# Patient Record
Sex: Female | Born: 1970 | Race: White | Hispanic: No | Marital: Married | Smoking: Current every day smoker
Health system: Southern US, Community
[De-identification: ages and names within clinical notes are randomized; demographics above are authoritative.]

## PROBLEM LIST (undated history)

## (undated) DIAGNOSIS — Q441 Other congenital malformations of gallbladder: Secondary | ICD-10-CM

## (undated) HISTORY — PX: ECTOPIC PREGNANCY SURGERY: SHX613

## (undated) HISTORY — PX: ABDOMINAL HYSTERECTOMY: SHX81

## (undated) HISTORY — PX: MOUTH SURGERY: SHX715

## (undated) HISTORY — PX: TONSILLECTOMY: SUR1361

## (undated) HISTORY — PX: NOVASURE ABLATION: SHX5394

---

## 2011-10-01 ENCOUNTER — Emergency Department (HOSPITAL_BASED_OUTPATIENT_CLINIC_OR_DEPARTMENT_OTHER): Payer: Self-pay

## 2011-10-01 ENCOUNTER — Observation Stay (HOSPITAL_BASED_OUTPATIENT_CLINIC_OR_DEPARTMENT_OTHER)
Admission: EM | Admit: 2011-10-01 | Discharge: 2011-10-02 | Disposition: A | Discharge status: Home / Self Care | Payer: Self-pay | Attending: Internal Medicine | Admitting: Internal Medicine

## 2011-10-01 ENCOUNTER — Encounter (HOSPITAL_BASED_OUTPATIENT_CLINIC_OR_DEPARTMENT_OTHER): Payer: Self-pay | Admitting: *Deleted

## 2011-10-01 DIAGNOSIS — F17200 Nicotine dependence, unspecified, uncomplicated: Secondary | ICD-10-CM

## 2011-10-01 DIAGNOSIS — J039 Acute tonsillitis, unspecified: Secondary | ICD-10-CM

## 2011-10-01 DIAGNOSIS — Z720 Tobacco use: Secondary | ICD-10-CM | POA: Diagnosis present

## 2011-10-01 DIAGNOSIS — J3501 Chronic tonsillitis: Principal | ICD-10-CM | POA: Insufficient documentation

## 2011-10-01 LAB — BASIC METABOLIC PANEL
BUN: 7 mg/dL (ref 6–23)
BUN: 6 mg/dL (ref 6–23)
CO2: 24 mEq/L (ref 19–32)
CO2: 25 mEq/L (ref 19–32)
Chloride: 100 mEq/L (ref 96–112)
Chloride: 103 mEq/L (ref 96–112)
Creatinine, Ser: 0.57 mg/dL (ref 0.50–1.10)
GFR calc non Af Amer: >90 mL/min (ref >90)
Glucose, Bld: 115 mg/dL — ABNORMAL HIGH (ref 70–99)
Potassium: 3.6 mEq/L (ref 3.5–5.1)
Potassium: 3.5 mEq/L (ref 3.5–5.1)

## 2011-10-01 LAB — CBC
HCT: 39.4 % (ref 36.0–46.0)
Hemoglobin: 13.7 g/dL (ref 12.0–15.0)
MCV: 89.5 fL (ref 78.0–100.0)
RBC: 4.4 MIL/uL (ref 3.87–5.11)
RDW: 12.9 % (ref 11.5–15.5)
WBC: 11 10*3/uL — ABNORMAL HIGH (ref 4.0–10.5)

## 2011-10-01 LAB — CBC WITH DIFFERENTIAL/PLATELET
Basophils Absolute: 0 10*3/uL (ref 0.0–0.1)
Basophils Relative: 0 % (ref 0–1)
Hemoglobin: 13.6 g/dL (ref 12.0–15.0)
Lymphocytes Relative: 28 % (ref 12–46)
Lymphs Abs: 3 10*3/uL (ref 0.7–4.0)
MCHC: 35.4 g/dL (ref 30.0–36.0)
Neutro Abs: 7.3 10*3/uL (ref 1.7–7.7)
Neutrophils Relative %: 68 % (ref 43–77)
Platelets: 267 10*3/uL (ref 150–400)
Promyelocytes Absolute: 0 %
RBC: 4.35 MIL/uL (ref 3.87–5.11)
nRBC: 0 /100 WBC

## 2011-10-01 LAB — CREATININE, SERUM: GFR calc Af Amer: >90 mL/min (ref >90)

## 2011-10-01 LAB — RAPID STREP SCREEN (MED CTR MEBANE ONLY): Streptococcus, Group A Screen (Direct): NEGATIVE

## 2011-10-01 LAB — MAGNESIUM: Magnesium: 2.1 mg/dL (ref 1.5–2.5)

## 2011-10-01 MED ORDER — SODIUM CHLORIDE 0.9 % IV SOLN
INTRAVENOUS | Status: AC
  Administered 2011-10-01: 07:00:00 via INTRAVENOUS

## 2011-10-01 MED ORDER — NICOTINE 14 MG/24HR TD PT24
14.0000 mg | MEDICATED_PATCH | Freq: Every day | TRANSDERMAL | Status: DC
  Administered 2011-10-01: 14 mg via TRANSDERMAL
  Filled 2011-10-01 (×2): qty 1

## 2011-10-01 MED ORDER — PREDNISONE 5 MG/5ML PO SOLN
10.0000 mg | Freq: Two times a day (BID) | ORAL | Status: DC
  Administered 2011-10-01 – 2011-10-02 (×3): 10 mg via ORAL
  Filled 2011-10-01 (×5): qty 10

## 2011-10-01 MED ORDER — SODIUM CHLORIDE 0.9 % IJ SOLN: 3.0000 mL | Freq: Two times a day (BID) | INTRAMUSCULAR | Status: DC

## 2011-10-01 MED ORDER — ACETAMINOPHEN 325 MG PO TABS
650.0000 mg | ORAL_TABLET | ORAL | Status: DC | PRN
  Administered 2011-10-01 (×2): 650 mg via ORAL
  Filled 2011-10-01 (×3): qty 2

## 2011-10-01 MED ORDER — SODIUM CHLORIDE 0.9 % IV SOLN
INTRAVENOUS | Status: DC
  Administered 2011-10-01 (×2): via INTRAVENOUS
  Administered 2011-10-01: 250 mL via INTRAVENOUS
  Administered 2011-10-01: via INTRAVENOUS

## 2011-10-01 MED ORDER — ONDANSETRON HCL 4 MG/2ML IJ SOLN
4.0000 mg | Freq: Once | INTRAMUSCULAR | Status: AC
  Administered 2011-10-01: 4 mg via INTRAVENOUS
  Filled 2011-10-01: qty 2

## 2011-10-01 MED ORDER — FENTANYL CITRATE 0.05 MG/ML IJ SOLN
50.0000 ug | INTRAMUSCULAR | Status: DC | PRN
  Administered 2011-10-01 – 2011-10-02 (×5): 50 ug via INTRAVENOUS
  Filled 2011-10-01 (×4): qty 2

## 2011-10-01 MED ORDER — FENTANYL CITRATE 0.05 MG/ML IJ SOLN
100.0000 ug | Freq: Once | INTRAMUSCULAR | Status: AC
  Administered 2011-10-01: 100 ug via INTRAVENOUS
  Filled 2011-10-01: qty 2

## 2011-10-01 MED ORDER — DEXAMETHASONE SODIUM PHOSPHATE 10 MG/ML IJ SOLN
10.0000 mg | Freq: Once | INTRAMUSCULAR | Status: AC
  Administered 2011-10-01: 10 mg via INTRAVENOUS
  Filled 2011-10-01: qty 1

## 2011-10-01 MED ORDER — FENTANYL CITRATE 0.05 MG/ML IJ SOLN
50.0000 ug | Freq: Once | INTRAMUSCULAR | Status: AC
  Administered 2011-10-01: 50 ug via INTRAVENOUS

## 2011-10-01 MED ORDER — CLINDAMYCIN PHOSPHATE 600 MG/50ML IV SOLN
600.0000 mg | Freq: Once | INTRAVENOUS | Status: AC
  Administered 2011-10-01: 600 mg via INTRAVENOUS
  Filled 2011-10-01: qty 50

## 2011-10-01 MED ORDER — ONDANSETRON HCL 4 MG/2ML IJ SOLN: 4.0000 mg | Freq: Three times a day (TID) | INTRAMUSCULAR | Status: AC | PRN

## 2011-10-01 MED ORDER — FENTANYL CITRATE 0.05 MG/ML IJ SOLN
INTRAMUSCULAR | Status: AC
  Administered 2011-10-01: 50 ug via INTRAVENOUS
  Filled 2011-10-01: qty 2

## 2011-10-01 MED ORDER — ENOXAPARIN SODIUM 40 MG/0.4ML ~~LOC~~ SOLN
40.0000 mg | SUBCUTANEOUS | Status: DC
  Administered 2011-10-01: 40 mg via SUBCUTANEOUS
  Filled 2011-10-01 (×2): qty 0.4

## 2011-10-01 MED ORDER — IOHEXOL 300 MG/ML  SOLN
80.0000 mL | Freq: Once | INTRAMUSCULAR | Status: AC | PRN
  Administered 2011-10-01: 80 mL via INTRAVENOUS

## 2011-10-01 MED ORDER — CLINDAMYCIN PHOSPHATE 600 MG/50ML IV SOLN
600.0000 mg | Freq: Four times a day (QID) | INTRAVENOUS | Status: DC
  Administered 2011-10-01 – 2011-10-02 (×5): 600 mg via INTRAVENOUS
  Filled 2011-10-01 (×11): qty 50

## 2011-10-01 NOTE — Progress Notes (Signed)
Patient ID: Cheryl Simon, female   DOB: 05-20-1970, 41 y.o.   MRN: [id number] Pt seen and focused examination performed.   Review of systems: The patient's only complaint is pain in the throat. She has been able to tolerate a liquid diet throughout the day. She otherwise has no other complaint.  Spoke with Dr. Ezzard Standing who has given the patient the option to have a tonsillectomy done tomorrow morning or to have it done electively in the future. The patient has conferred with her husband and decision has been made to proceed with the tonsillectomy tomorrow morning. I have asked the nurse to contact Dr. Ezzard Standing and make him aware of the patient's decision so that she can reschedule for procedure tomorrow morning  Focused examination: HEENT: Patient has 3+ tonsils bilaterally with whitish exudate on left tonsil. No stridor noted on examination Lungs: Clear to auscultation no wheezing noted. Cardiovascular: S1 and S2 normal no murmurs rubs or gallops noted.  Plan: Patient will have steroids discontinued at her dose tonight. I've asked him to contact Dr. Ezzard Standing for preoperative orders and the patient is planned to have a tonsillectomy in the morning. Meanwhile she'll continue on IV clindamycin until she is able to take orals and will likely be discharged home on Augmentin.  Total time in this visit approximately 20 minutes

## 2011-10-01 NOTE — Consult Note (Signed)
Reason for Consult:evaluate patient with tonsillitis and early abcess Referring Physician: ER  Cheryl Simon is an 41 y.o. female.  HPI: 3 day history of worsening sore throat and trouble swallowing. CT scan showed enlarged tonsils and early abscess on L side. apprx 1 cm. She was treatd with steroids and ant biotics IV and doing much better today. Able to swallow and no airway compromise. Has had history of tonsil infections in the past with large tonsils.  History reviewed. No pertinent past medical history.  Past Surgical History  Procedure Date  . Abdominal hysterectomy     Social History:  reports that she has been smoking Cigarettes.  She has a 20 pack-year smoking history. She does not have any smokeless tobacco history on file. She reports that she drinks alcohol. She reports that she uses illicit drugs (Marijuana) about once per week.  Allergies:  Allergies  Allergen Reactions  . Dilaudid (Hydromorphone Hcl)     SOB     Medications: I have reviewed the patient's current medications.  Results for orders placed during the hospital encounter of 10/01/11 (from the past 48 hour(s))  RAPID STREP SCREEN     Status: Normal   Collection Time   10/01/11 12:43 AM      Component Value Range Comment   Streptococcus, Group A Screen (Direct) NEGATIVE  NEGATIVE   CBC WITH DIFFERENTIAL     Status: Abnormal   Collection Time   10/01/11  1:21 AM      Component Value Range Comment   WBC 10.7 (*) 4.0 - 10.5 K/uL WHITE COUNT CONFIRMED ON SMEAR   RBC 4.35  3.87 - 5.11 MIL/uL    Hemoglobin 13.6  12.0 - 15.0 g/dL    HCT 03.4  74.2 - 59.5 %    MCV 88.3  78.0 - 100.0 fL    MCH 31.3  26.0 - 34.0 pg    MCHC 35.4  30.0 - 36.0 g/dL    RDW 63.8  75.6 - 43.3 %    Platelets 267  150 - 400 K/uL    Neutrophils Relative 68  43 - 77 %    Lymphocytes Relative 28  12 - 46 %    Monocytes Relative 4  3 - 12 %    Eosinophils Relative 0  0 - 5 %    Basophils Relative 0  0 - 1 %    Band Neutrophils 0  0 -  10 %    Metamyelocytes Relative 0      Myelocytes 0      Promyelocytes Absolute 0      Blasts 0      nRBC 0  0 /100 WBC    Neutro Abs 7.3  1.7 - 7.7 K/uL    Lymphs Abs 3.0  0.7 - 4.0 K/uL    Monocytes Absolute 0.4  0.1 - 1.0 K/uL    Eosinophils Absolute 0.0  0.0 - 0.7 K/uL    Basophils Absolute 0.0  0.0 - 0.1 K/uL    WBC Morphology ATYPICAL LYMPHOCYTES      Smear Review LARGE PLATELETS PRESENT     BASIC METABOLIC PANEL     Status: Abnormal   Collection Time   10/01/11  1:21 AM      Component Value Range Comment   Sodium 137  135 - 145 mEq/L    Potassium 3.6  3.5 - 5.1 mEq/L    Chloride 100  96 - 112 mEq/L    CO2 24  19 -  32 mEq/L    Glucose, Bld 115 (*) 70 - 99 mg/dL    BUN 7  6 - 23 mg/dL    Creatinine, Ser 1.61  0.50 - 1.10 mg/dL    Calcium 9.2  8.4 - 09.6 mg/dL    GFR calc non Af Amer >90  >90 mL/min    GFR calc Af Amer >90  >90 mL/min   MONONUCLEOSIS SCREEN     Status: Normal   Collection Time   10/01/11  2:00 AM      Component Value Range Comment   Mono Screen NEGATIVE  NEGATIVE   CBC     Status: Abnormal   Collection Time   10/01/11  8:14 AM      Component Value Range Comment   WBC 11.0 (*) 4.0 - 10.5 K/uL    RBC 4.40  3.87 - 5.11 MIL/uL    Hemoglobin 13.7  12.0 - 15.0 g/dL    HCT 04.5  40.9 - 81.1 %    MCV 89.5  78.0 - 100.0 fL    MCH 31.1  26.0 - 34.0 pg    MCHC 34.8  30.0 - 36.0 g/dL    RDW 91.4  78.2 - 95.6 %    Platelets 277  150 - 400 K/uL   CREATININE, SERUM     Status: Normal   Collection Time   10/01/11  8:14 AM      Component Value Range Comment   Creatinine, Ser 0.55  0.50 - 1.10 mg/dL    GFR calc non Af Amer >90  >90 mL/min    GFR calc Af Amer >90  >90 mL/min     Ct Soft Tissue Neck W Contrast  10/01/2011  *RADIOLOGY REPORT*  Clinical Data: Sore throat, evaluate for peritonsillar abscess. Fever, body aches.  CT NECK WITH CONTRAST  Technique:  Multidetector CT imaging of the neck was performed with intravenous contrast.  Contrast: 80mL OMNIPAQUE  IOHEXOL 300 MG/ML  SOLN  Comparison: None.  Findings: Visualized intracranial contents are within normal limits.  Lung apices are clear.  Unremarkable nasopharynx and nasal cavity.  Bilateral tonsillar enlargement, left greater than right, with a 1 cm hypoattenuation along the anterolateral margin of the left tonsil as seen on series 3 image 41.  The tonsillar hypertrophy results in significant oral pharyngeal airway narrowing. Unremarkable oral cavity.  Associated lymphatic hypertrophy extends inferiorly along the anterior margin to the level of the valleculae.  The epiglottis appears normal.  Unremarkable hypopharynx, larynx, and thyroid gland.  Normal caliber vasculature.  Symmetric parotid glands.  There is a prominent left anterior cervical chain lymph nodes measuring up to 1.3 cm short axis.  Submandibular gland on the left is mildly prominent however enhances homogeneous to the contralateral side.  No acute osseous finding.  IMPRESSION: Bilateral palatine tonsillar enlargement with phlegmon/developing peritonsillar abscess on the left measuring 1 cm.  There is continuous lymphatic hypertrophy extending inferior from the tonsils to the level of the valleculae. Likely infectious/inflammatory,  however recommend direct inspection to exclude mass.  Prominent left cervical chain lymph nodes, may be reactive.  Original Report Authenticated By: Waneta Martins, M.D.    OZH:YQMV throat doing better today. No trouble breathing   PE:A & O x 3. No airway problems. Oral exam 3+ tonsils bilaterally with purrulence on the L  tonsil Minimal neck adenoathy  Assessment/Plan: Acute tonsillitis with early abscess   Recommendation: Patient needs tonsillectomy at some point. Could consider tonsillectomy this hospital stay tomorrow am  or treat with antibiotics (I prefer Augmentin) and schedule tonsillectomy at a later date. Can follow up with me as outpatient  [phone] Dillard Cannon 10/01/2011, 12:34 PM

## 2011-10-01 NOTE — ED Notes (Signed)
Pt with sore throat congestion body aches and fever x 3 days pt tearful and anxious in triage

## 2011-10-01 NOTE — Progress Notes (Signed)
PENDING ACCEPTANCE TRANFER NOTE:  Call received from:   Dr Paula Libra from [facility]  REASON FOR REQUESTING TRANSFER:    Severe tonsillitis  HPI:   41 yo with swollen "kissing" tonsils.  Seen by CT as well.  Not drooling, but very anxious and having anterior instead of posterior cervical adenopathy.  He has consulted ENT, who felt it is not a surgical problem at this time and will consult later today.  She was given steroids and Clindamycin.  Monotest was negative at this time.   PLAN:  According to telephone report, this patient was accepted for transfer to either stepdown or telemetry as per Dr Thayer Ohm judgement since I have not seen this patient,   Under [facility] team:  [employer] or [facility],  I have requested an order be written to call Flow Manager at [phone] upon patient arrival to the floor for final physician assignment who will do the admission and give admitting orders.  SIGNEDHouston Siren, MD [facility]  10/01/2011, 3:47 AM

## 2011-10-01 NOTE — ED Provider Notes (Signed)
History     CSN: [id number]  Arrival date & time 10/01/11  0029   First MD Initiated Contact with Patient 10/01/11 0055      Chief Complaint  Patient presents with  . Sore Throat    (Consider location/radiation/quality/duration/timing/severity/associated sxs/prior treatment) HPI This is a 41 year old white female with a three-day history of sore throat. The pain in throat has become severe and is worse with swallowing. She's not been able to eat due to pain. She also feels like she is having difficulty breathing due to the swelling in her throat. She's not sure she's had a fever as she states she has not had time to take her temperature. The pain is radiating to her face particularly on the left. There is enlargement of her anterior cervical lymph nodes. She denies nausea, vomiting or diarrhea. She has had an occasional cough.  History reviewed. No pertinent past medical history.  Past Surgical History  Procedure Date  . Abdominal hysterectomy     History reviewed. No pertinent family history.  History  Substance Use Topics  . Smoking status: Current Everyday Smoker  . Smokeless tobacco: Not on file  . Alcohol Use: No    OB History    Grav Para Term Preterm Abortions TAB SAB Ect Mult Living                  Review of Systems  All other systems reviewed and are negative.    Allergies  Dilaudid  Home Medications  No current outpatient prescriptions on file.  BP 136/51  Pulse 84  Temp 99.4 F (37.4 C) (Oral)  Resp 18  SpO2 99%  Physical Exam General: Well-developed, well-nourished female in no acute distress; appearance consistent with age of record HENT: normocephalic, atraumatic; erythematous, exudative, enlarged "kissing" tonsils with slight deviation to the right; "hot potato" voice; TMs normal Eyes: pupils equal round and reactive to light; extraocular muscles intact Neck: supple; tender anterior cervical lymphadenopathy Heart: regular rate and rhythm;  no murmurs, rubs or gallops Lungs: clear to auscultation bilaterally Abdomen: soft; nondistended; nontender Extremities: No deformity; full range of motion Neurologic: Awake, alert; motor function intact in all extremities and symmetric; no facial droop Skin: Warm and dry Psychiatric: Anxious; tearful    ED Course  Procedures (including critical care time)     MDM   Nursing notes and vitals signs, including pulse oximetry, reviewed.  Summary of this visit's results, reviewed by myself:  Labs:  Results for orders placed during the hospital encounter of 10/01/11  RAPID STREP SCREEN      Component Value Range   Streptococcus, Group A Screen (Direct) NEGATIVE  NEGATIVE  CBC WITH DIFFERENTIAL      Component Value Range   WBC 10.7 (*) 4.0 - 10.5 K/uL   RBC 4.35  3.87 - 5.11 MIL/uL   Hemoglobin 13.6  12.0 - 15.0 g/dL   HCT 46.9  62.9 - 52.8 %   MCV 88.3  78.0 - 100.0 fL   MCH 31.3  26.0 - 34.0 pg   MCHC 35.4  30.0 - 36.0 g/dL   RDW 41.3  24.4 - 01.0 %   Platelets 267  150 - 400 K/uL   Neutrophils Relative 68  43 - 77 %   Lymphocytes Relative 28  12 - 46 %   Monocytes Relative 4  3 - 12 %   Eosinophils Relative 0  0 - 5 %   Basophils Relative 0  0 - 1 %  Band Neutrophils 0  0 - 10 %   Metamyelocytes Relative 0     Myelocytes 0     Promyelocytes Absolute 0     Blasts 0     nRBC 0  0 /100 WBC   Neutro Abs 7.3  1.7 - 7.7 K/uL   Lymphs Abs 3.0  0.7 - 4.0 K/uL   Monocytes Absolute 0.4  0.1 - 1.0 K/uL   Eosinophils Absolute 0.0  0.0 - 0.7 K/uL   Basophils Absolute 0.0  0.0 - 0.1 K/uL   WBC Morphology ATYPICAL LYMPHOCYTES     Smear Review LARGE PLATELETS PRESENT    BASIC METABOLIC PANEL      Component Value Range   Sodium 137  135 - 145 mEq/L   Potassium 3.6  3.5 - 5.1 mEq/L   Chloride 100  96 - 112 mEq/L   CO2 24  19 - 32 mEq/L   Glucose, Bld 115 (*) 70 - 99 mg/dL   BUN 7  6 - 23 mg/dL   Creatinine, Ser 1.61  0.50 - 1.10 mg/dL   Calcium 9.2  8.4 - 09.6 mg/dL   GFR  calc non Af Amer >90  >90 mL/min   GFR calc Af Amer >90  >90 mL/min  MONONUCLEOSIS SCREEN      Component Value Range   Mono Screen NEGATIVE  NEGATIVE    Imaging Studies: Ct Soft Tissue Neck W Contrast  10/01/2011  *RADIOLOGY REPORT*  Clinical Data: Sore throat, evaluate for peritonsillar abscess. Fever, body aches.  CT NECK WITH CONTRAST  Technique:  Multidetector CT imaging of the neck was performed with intravenous contrast.  Contrast: 80mL OMNIPAQUE IOHEXOL 300 MG/ML  SOLN  Comparison: None.  Findings: Visualized intracranial contents are within normal limits.  Lung apices are clear.  Unremarkable nasopharynx and nasal cavity.  Bilateral tonsillar enlargement, left greater than right, with a 1 cm hypoattenuation along the anterolateral margin of the left tonsil as seen on series 3 image 41.  The tonsillar hypertrophy results in significant oral pharyngeal airway narrowing. Unremarkable oral cavity.  Associated lymphatic hypertrophy extends inferiorly along the anterior margin to the level of the valleculae.  The epiglottis appears normal.  Unremarkable hypopharynx, larynx, and thyroid gland.  Normal caliber vasculature.  Symmetric parotid glands.  There is a prominent left anterior cervical chain lymph nodes measuring up to 1.3 cm short axis.  Submandibular gland on the left is mildly prominent however enhances homogeneous to the contralateral side.  No acute osseous finding.  IMPRESSION: Bilateral palatine tonsillar enlargement with phlegmon/developing peritonsillar abscess on the left measuring 1 cm.  There is continuous lymphatic hypertrophy extending inferior from the tonsils to the level of the valleculae. Likely infectious/inflammatory,  however recommend direct inspection to exclude mass.  Prominent left cervical chain lymph nodes, may be reactive.  Original Report Authenticated By: Waneta Martins, M.D.   4:30 AM Patient resting comfortably. Patient given dexamethasone 10 mg IV and  clindamycin 600 mg IV. Dr. Ezzard Standing was consulted. He will see the patient in consult but requests hospitalist admission. Dr. Conley Rolls has accepted for admission to [facility].         Hanley Seamen, MD 10/01/11 [phone]

## 2011-10-01 NOTE — H&P (Signed)
[facility] History and Physical  Cheryl Simon OZH:[id number] DOB: [birth year]    PCP:   None  Chief Complaint: Sorethroat for three days.  HPI: Cheryl Simon is an 41 y.o. female with benign PMH including s/p hysterectomy, presents to [facility] with sorethroat for the past three days.  Her son also had sorethroat but did well.  She has trouble swallowing due to pain, but has no drooling or trouble breathing.  She denied fever, chills, nausea, vomitting, join aches, or any rash.  She has no distant travel, and said she was at low risk for any STD.  Evaluation in the ER at [facility] included a negative mono and negative rapid strept.  She had a CT which showed bilateral palatine tonsillar enlargement with phlegmon/developing peritonsillar abscess on the left measuring 1 cm. There is continuous lymphatic hypertrophy extending inferior from the tonsils to the level of the valleculae.  EDP consulted Dr Ezzard Standing of ENT who would be graciously provide consultation. Hospitalist was asked to admit patient.   Rewiew of Systems:  Constitutional: Negative for malaise, fever and chills. No significant weight loss or weight gain Eyes: Negative for eye pain, redness and discharge, diplopia, visual changes, or flashes of light. ENMT: Negative for ear pain, hoarseness, nasal congestion, sinus pressure and sore throat. No headaches; tinnitus, drooling,  Cardiovascular: Negative for chest pain, palpitations, diaphoresis, dyspnea and peripheral edema. ; No orthopnea, PND Respiratory: Negative for cough, hemoptysis, wheezing and stridor. No pleuritic chestpain. Gastrointestinal: Negative for nausea, vomiting, diarrhea, constipation, abdominal pain, melena, blood in stool, hematemesis, jaundice and rectal bleeding.    Genitourinary: Negative for frequency, dysuria, incontinence,flank pain and hematuria; Musculoskeletal: Negative for back pain and neck pain. Negative for swelling and trauma.;  Skin: . Negative for  pruritus, rash, abrasions, bruising and skin lesion.; ulcerations Neuro: Negative for headache, lightheadedness and neck stiffness. Negative for weakness, altered level of consciousness , altered mental status, extremity weakness, burning feet, involuntary movement, seizure and syncope.  Psych: negative for anxiety, depression, insomnia, tearfulness, panic attacks, hallucinations, paranoia, suicidal or homicidal ideation   History reviewed. No pertinent past medical history.  Past Surgical History  Procedure Date  . Abdominal hysterectomy     Medications:  HOME MEDS: Prior to Admission medications   Not on File     Allergies:  Allergies  Allergen Reactions  . Dilaudid (Hydromorphone Hcl)     SOB     Social History:   reports that she has been smoking Cigarettes.  She has a 20 pack-year smoking history. She does not have any smokeless tobacco history on file. She reports that she drinks alcohol. She reports that she uses illicit drugs (Marijuana) about once per week.  Family History: History reviewed. No pertinent family history.   Physical Exam: Filed Vitals:   10/01/11 0210 10/01/11 0323 10/01/11 0515 10/01/11 0623  BP:  111/52 113/59 116/75  Pulse:  60 57 79  Temp:    98.4 F (36.9 C)  TempSrc:    Oral  Resp:    18  Height:    5\' 7"  (1.702 m)  Weight:    89.7 kg (197 lb 12 oz)  SpO2: 99% 99% 95% 94%   Blood pressure 116/75, pulse 79, temperature 98.4 F (36.9 C), temperature source Oral, resp. rate 18, height 5\' 7"  (1.702 m), weight 89.7 kg (197 lb 12 oz), SpO2 94.00%.  GEN:  Pleasant  patient lying in the stretcher in no acute distress; cooperative with exam.  PSYCH:  alert and oriented  x4; does not appear anxious or depressed; affect is appropriate. HEENT: Mucous membranes pink and anicteric; PERRLA; EOM intact; bilateral tender anterior cervical lymphadenopathy with no thyromegaly or carotid bruit; no JVD; There were no stridor. Neck is very supple. Breasts::  Not examined CHEST WALL: No tenderness CHEST: Normal respiration, clear to auscultation bilaterally.  HEART: Regular rate and rhythm.  There are no murmur, rub, or gallops.   BACK: No kyphosis or scoliosis; no CVA tenderness ABDOMEN: soft and non-tender; no masses, no organomegaly, normal abdominal bowel sounds; no pannus; no intertriginous candida. There is no rebound and no distention. Rectal Exam: Not done EXTREMITIES: No bone or joint deformity; age-appropriate arthropathy of the hands and knees; no edema; no ulcerations.  There is no calf tenderness. Genitalia: not examined PULSES: 2+ and symmetric SKIN: Normal hydration no rash or ulceration CNS: Cranial nerves 2-12 grossly intact no focal lateralizing neurologic deficit.  Speech is fluent; uvula elevated with phonation, facial symmetry and tongue midline. DTR are normal bilaterally, cerebella exam is intact, barbinski is negative and strengths are equaled bilaterally.  No sensory loss.   Labs on Admission:  Basic Metabolic Panel:  Lab 10/01/11 7846  NA 137  K 3.6  CL 100  CO2 24  GLUCOSE 115*  BUN 7  CREATININE 0.60  CALCIUM 9.2  MG --  PHOS --   Liver Function Tests: No results found for this basename: AST:5,ALT:5,ALKPHOS:5,BILITOT:5,PROT:5,ALBUMIN:5 in the last 168 hours No results found for this basename: LIPASE:5,AMYLASE:5 in the last 168 hours No results found for this basename: AMMONIA:5 in the last 168 hours CBC:  Lab 10/01/11 0121  WBC 10.7*  NEUTROABS 7.3  HGB 13.6  HCT 38.4  MCV 88.3  PLT 267   Cardiac Enzymes: No results found for this basename: CKTOTAL:5,CKMB:5,CKMBINDEX:5,TROPONINI:5 in the last 168 hours  CBG: No results found for this basename: GLUCAP:5 in the last 168 hours   Radiological Exams on Admission: Ct Soft Tissue Neck W Contrast  10/01/2011  *RADIOLOGY REPORT*  Clinical Data: Sore throat, evaluate for peritonsillar abscess. Fever, body aches.  CT NECK WITH CONTRAST  Technique:   Multidetector CT imaging of the neck was performed with intravenous contrast.  Contrast: 80mL OMNIPAQUE IOHEXOL 300 MG/ML  SOLN  Comparison: None.  Findings: Visualized intracranial contents are within normal limits.  Lung apices are clear.  Unremarkable nasopharynx and nasal cavity.  Bilateral tonsillar enlargement, left greater than right, with a 1 cm hypoattenuation along the anterolateral margin of the left tonsil as seen on series 3 image 41.  The tonsillar hypertrophy results in significant oral pharyngeal airway narrowing. Unremarkable oral cavity.  Associated lymphatic hypertrophy extends inferiorly along the anterior margin to the level of the valleculae.  The epiglottis appears normal.  Unremarkable hypopharynx, larynx, and thyroid gland.  Normal caliber vasculature.  Symmetric parotid glands.  There is a prominent left anterior cervical chain lymph nodes measuring up to 1.3 cm short axis.  Submandibular gland on the left is mildly prominent however enhances homogeneous to the contralateral side.  No acute osseous finding.  IMPRESSION: Bilateral palatine tonsillar enlargement with phlegmon/developing peritonsillar abscess on the left measuring 1 cm.  There is continuous lymphatic hypertrophy extending inferior from the tonsils to the level of the valleculae. Likely infectious/inflammatory,  however recommend direct inspection to exclude mass.  Prominent left cervical chain lymph nodes, may be reactive.  Original Report Authenticated By: Waneta Martins, M.D.       Assessment/Plan Present on Admission:  .Tobacco abuse Tonsillitis  PLAN:  Will give clear liquid.  I will continue prednisone in liquid form.  She was given Clindamycin and I will continue IV.  Note she cannot take Dilaudid, and Fentanyl IV will be given.  She will be seen in consultation with ENT.  Will also give IVF.  I recommended that she stop her cigarettes.  She is otherwise stable, full code, and will be admitted to telemetry  under TRH.  Other plans as per orders.  Code Status: FULL Unk Lightning, MD. [facility] Pager [phone] 7pm to 7am.  10/01/2011, 6:50 AM

## 2011-10-01 NOTE — Progress Notes (Signed)
Patient arrived from [facility] via [facility].  She is aox4, from home with husband.  Patient educated on call bell, safety measures, and belongings policy.  She acknowledged understanding.  She also refused safety video.  Awaiting admitting orders.  Low fall risk.  POC: control pain and hydrate via IV fluids.  Will continue to monitor patient.

## 2011-10-01 NOTE — Progress Notes (Signed)
Patient had two beats of R on T on telemetry.  She denied CP or SOB.  VS BP 117/70 P 56 and 97% RA.  She appears to be in no acute distress.  MD notified.  EKG order obtained and no R on Ts able to be captured.  EKG states SB.  Will continue to monitor patient.

## 2011-10-01 NOTE — ED Notes (Signed)
Pt 02 sats briefly dropped to 85 pt laced on 2 L Carrick and sats improved to 97% EDP made aware

## 2011-10-02 ENCOUNTER — Encounter (HOSPITAL_COMMUNITY): Payer: Self-pay | Admitting: *Deleted

## 2011-10-02 ENCOUNTER — Encounter (HOSPITAL_COMMUNITY)
Admission: EM | Disposition: A | Discharge status: Home / Self Care | Payer: Self-pay | Source: Home / Self Care | Attending: Internal Medicine

## 2011-10-02 ENCOUNTER — Observation Stay (HOSPITAL_COMMUNITY): Payer: Self-pay | Admitting: *Deleted

## 2011-10-02 DIAGNOSIS — F17200 Nicotine dependence, unspecified, uncomplicated: Secondary | ICD-10-CM

## 2011-10-02 HISTORY — PX: TONSILLECTOMY: SHX5217

## 2011-10-02 LAB — CBC
HCT: 35.2 % — ABNORMAL LOW (ref 36.0–46.0)
Hemoglobin: 11.7 g/dL — ABNORMAL LOW (ref 12.0–15.0)
MCV: 90.7 fL (ref 78.0–100.0)
RBC: 3.88 MIL/uL (ref 3.87–5.11)
WBC: 13.7 10*3/uL — ABNORMAL HIGH (ref 4.0–10.5)

## 2011-10-02 LAB — BASIC METABOLIC PANEL
CO2: 24 mEq/L (ref 19–32)
Chloride: 108 mEq/L (ref 96–112)
Potassium: 3.8 mEq/L (ref 3.5–5.1)
Sodium: 140 mEq/L (ref 135–145)

## 2011-10-02 LAB — SURGICAL PCR SCREEN: Staphylococcus aureus: NEGATIVE

## 2011-10-02 SURGERY — TONSILLECTOMY
Anesthesia: General | Site: Throat | Laterality: Bilateral | Wound class: Clean Contaminated

## 2011-10-02 MED ORDER — LACTATED RINGERS IV SOLN
INTRAVENOUS | Status: DC
  Administered 2011-10-02: 09:00:00 via INTRAVENOUS

## 2011-10-02 MED ORDER — ONDANSETRON HCL 4 MG/2ML IJ SOLN
INTRAMUSCULAR | Status: AC
  Filled 2011-10-02: qty 2

## 2011-10-02 MED ORDER — AMOXICILLIN 250 MG/5ML PO SUSR: 500.0000 mg | Freq: Two times a day (BID) | ORAL | Status: AC

## 2011-10-02 MED ORDER — LACTATED RINGERS IV SOLN
INTRAVENOUS | Status: DC | PRN
  Administered 2011-10-02: 10:00:00 via INTRAVENOUS

## 2011-10-02 MED ORDER — FENTANYL CITRATE 0.05 MG/ML IJ SOLN
INTRAMUSCULAR | Status: AC
  Filled 2011-10-02: qty 2

## 2011-10-02 MED ORDER — ONDANSETRON HCL 4 MG/2ML IJ SOLN
4.0000 mg | Freq: Once | INTRAMUSCULAR | Status: AC | PRN
  Administered 2011-10-02: 4 mg via INTRAVENOUS

## 2011-10-02 MED ORDER — HYDROMORPHONE HCL PF 1 MG/ML IJ SOLN
INTRAMUSCULAR | Status: AC
  Filled 2011-10-02: qty 1

## 2011-10-02 MED ORDER — FENTANYL CITRATE 0.05 MG/ML IJ SOLN
INTRAMUSCULAR | Status: DC | PRN
  Administered 2011-10-02 (×2): 100 ug via INTRAVENOUS

## 2011-10-02 MED ORDER — MIDAZOLAM HCL 5 MG/5ML IJ SOLN
INTRAMUSCULAR | Status: DC | PRN
  Administered 2011-10-02: 2 mg via INTRAVENOUS

## 2011-10-02 MED ORDER — 0.9 % SODIUM CHLORIDE (POUR BTL) OPTIME
TOPICAL | Status: DC | PRN
  Administered 2011-10-02: 1000 mL

## 2011-10-02 MED ORDER — LIDOCAINE HCL (CARDIAC) 20 MG/ML IV SOLN
INTRAVENOUS | Status: DC | PRN
  Administered 2011-10-02: 60 mg via INTRAVENOUS

## 2011-10-02 MED ORDER — ROCURONIUM BROMIDE 100 MG/10ML IV SOLN
INTRAVENOUS | Status: DC | PRN
  Administered 2011-10-02: 20 mg via INTRAVENOUS

## 2011-10-02 MED ORDER — HYDROMORPHONE HCL PF 1 MG/ML IJ SOLN
0.2500 mg | INTRAMUSCULAR | Status: DC | PRN
  Administered 2011-10-02: 0.5 mg via INTRAVENOUS

## 2011-10-02 MED ORDER — HYDROCODONE-ACETAMINOPHEN 7.5-500 MG/15ML PO SOLN: 15.0000 mL | ORAL | Status: AC | PRN

## 2011-10-02 MED ORDER — PROPOFOL 10 MG/ML IV BOLUS
INTRAVENOUS | Status: DC | PRN
  Administered 2011-10-02: 200 mg via INTRAVENOUS

## 2011-10-02 MED ORDER — SUCCINYLCHOLINE CHLORIDE 20 MG/ML IJ SOLN
INTRAMUSCULAR | Status: DC | PRN
  Administered 2011-10-02: 100 mg via INTRAVENOUS

## 2011-10-02 SURGICAL SUPPLY — 28 items
CANISTER SUCTION 2500CC (MISCELLANEOUS) ×2 IMPLANT
CATH ROBINSON RED A/P 12FR (CATHETERS) IMPLANT
CLEANER TIP ELECTROSURG 2X2 (MISCELLANEOUS) ×2 IMPLANT
CLOTH BEACON ORANGE TIMEOUT ST (SAFETY) ×2 IMPLANT
COAGULATOR SUCT SWTCH 10FR 6 (ELECTROSURGICAL) IMPLANT
DRAPE PROXIMA HALF (DRAPES) ×2 IMPLANT
ELECT COATED BLADE 2.86 ST (ELECTRODE) ×2 IMPLANT
ELECT REM PT RETURN 9FT ADLT (ELECTROSURGICAL) ×2
ELECT REM PT RETURN 9FT PED (ELECTROSURGICAL)
ELECTRODE REM PT RETRN 9FT PED (ELECTROSURGICAL) IMPLANT
ELECTRODE REM PT RTRN 9FT ADLT (ELECTROSURGICAL) ×1 IMPLANT
GAUZE SPONGE 4X4 16PLY XRAY LF (GAUZE/BANDAGES/DRESSINGS) ×2 IMPLANT
GLOVE SS BIOGEL STRL SZ 7.5 (GLOVE) ×1 IMPLANT
GLOVE SUPERSENSE BIOGEL SZ 7.5 (GLOVE) ×1
GOWN PREVENTION PLUS XLARGE (GOWN DISPOSABLE) ×2 IMPLANT
GOWN STRL NON-REIN LRG LVL3 (GOWN DISPOSABLE) ×2 IMPLANT
KIT BASIN OR (CUSTOM PROCEDURE TRAY) ×2 IMPLANT
KIT ROOM TURNOVER OR (KITS) ×2 IMPLANT
NS IRRIG 1000ML POUR BTL (IV SOLUTION) ×2 IMPLANT
PACK SURGICAL SETUP 50X90 (CUSTOM PROCEDURE TRAY) ×2 IMPLANT
PAD ARMBOARD 7.5X6 YLW CONV (MISCELLANEOUS) ×4 IMPLANT
PENCIL FOOT CONTROL (ELECTRODE) ×2 IMPLANT
SPECIMEN JAR SMALL (MISCELLANEOUS) ×4 IMPLANT
SPONGE TONSIL 1 RF SGL (DISPOSABLE) IMPLANT
SYR BULB 3OZ (MISCELLANEOUS) ×2 IMPLANT
TOWEL OR 17X24 6PK STRL BLUE (TOWEL DISPOSABLE) ×4 IMPLANT
TUBE CONNECTING 12X1/4 (SUCTIONS) ×2 IMPLANT
WATER STERILE IRR 1000ML POUR (IV SOLUTION) ×2 IMPLANT

## 2011-10-02 NOTE — Transfer of Care (Signed)
Immediate Anesthesia Transfer of Care Note  Patient: Cheryl Simon  Procedure(s) Performed: Procedure(s) (LRB): TONSILLECTOMY (Bilateral)  Patient Location: PACU  Anesthesia Type: General  Level of Consciousness: awake, alert  and oriented  Airway & Oxygen Therapy: Patient Spontanous Breathing and Patient connected to face mask oxygen  Post-op Assessment: Report given to PACU RN and Post -op Vital signs reviewed and stable  Post vital signs: Reviewed and stable  Complications: No apparent anesthesia complications

## 2011-10-02 NOTE — Brief Op Note (Signed)
10/01/2011 - 10/02/2011  10:11 AM  PATIENT:  Cheryl Simon  41 y.o. female  PRE-OPERATIVE DIAGNOSIS:Left  Tonsil Abscess  POST-OPERATIVE DIAGNOSIS:Left  Tonsil Abscess  PROCEDURE:  Procedure(s) (LRB): TONSILLECTOMY (Bilateral)  SURGEON:  Surgeon(s) and Role:    * Drema Halon, MD - Primary  PHYSICIAN ASSISTANT:   ASSISTANTS: none   ANESTHESIA:   general  EBL:  Total I/O In: 500 [I.V.:500] Out: -   BLOOD ADMINISTERED:none  DRAINS: none   LOCAL MEDICATIONS USED:  NONE  SPECIMEN:  Source of Specimen:  tonsils  DISPOSITION OF SPECIMEN:  PATHOLOGY  COUNTS:  YES  TOURNIQUET:  * No tourniquets in log *  DICTATION: .Other Dictation: Dictation Number [id number]  PLAN OF CARE: Discharge to home after PACU  PATIENT DISPOSITION:  PACU - hemodynamically stable.   Delay start of Pharmacological VTE agent (>24hrs) due to surgical blood loss or risk of bleeding: not applicable

## 2011-10-02 NOTE — Anesthesia Postprocedure Evaluation (Signed)
  Anesthesia Post-op Note  Patient: Cheryl Simon  Procedure(s) Performed: Procedure(s) (LRB): TONSILLECTOMY (Bilateral)  Patient Location: PACU  Anesthesia Type: General  Level of Consciousness: awake, alert , oriented and patient cooperative  Airway and Oxygen Therapy: Patient Spontanous Breathing and Patient connected to nasal cannula oxygen  Post-op Pain: mild  Post-op Assessment: Post-op Vital signs reviewed, Patient's Cardiovascular Status Stable, Respiratory Function Stable, Patent Airway, No signs of Nausea or vomiting and Pain level controlled  Post-op Vital Signs: stable  Complications: No apparent anesthesia complications

## 2011-10-02 NOTE — H&P (Signed)
PREOPERATIVE H&P  Chief Complaint: tonsil abscess  HPI: Cheryl Simon is a 41 y.o. female who presents for evaluation of tonsillitis with tonsil abscess. Patient with history of tonsillitis  Is presently admitted because of acute tonsillitis with tonsil abscess and airway concerns. She is taken to the OR for tonsillectomy.  History reviewed. No pertinent past medical history. Past Surgical History  Procedure Date  . Abdominal hysterectomy    History   Social History  . Marital Status: Married    Spouse Name: N/A    Number of Children: N/A  . Years of Education: N/A   Social History Main Topics  . Smoking status: Current Everyday Smoker -- 1.0 packs/day for 20 years    Types: Cigarettes  . Smokeless tobacco: None  . Alcohol Use: Yes  . Drug Use: 1 per week    Special: Marijuana  . Sexually Active: Yes    Birth Control/ Protection: Other-see comments     hysterectomy   Other Topics Concern  . None   Social History Narrative  . None   History reviewed. No pertinent family history. Allergies  Allergen Reactions  . Dilaudid (Hydromorphone Hcl)     SOB    Prior to Admission medications   Medication Sig Start Date End Date Taking? Authorizing Provider  diphenhydrAMINE (BENADRYL) 25 mg capsule Take 25 mg by mouth every 6 (six) hours as needed. allergies   Yes Historical Provider, MD  ibuprofen (ADVIL,MOTRIN) 200 MG tablet Take 600 mg by mouth every 6 (six) hours as needed. For pain/fever   Yes Historical Provider, MD  Pseudoeph-Doxylamine-DM-APAP (NYQUIL PO) Take 30 mLs by mouth at bedtime as needed. Cold symptoms   Yes Historical Provider, MD     Positive ROS: sore throat x 4 days  All other systems have been reviewed and were otherwise negative with the exception of those mentioned in the HPI and as above.  Physical Exam: Filed Vitals:   10/02/11 0546  BP: 115/71  Pulse: 44  Temp: 97.5 F (36.4 C)  Resp: 17    General: Alert, no acute distress Oral: Normal  oral mucosa and 3+ tonsils with exudate on L tonsil Nasal: Clear nasal passages Neck: No palpable adenopathy or thyroid nodules Ear: Ear canal is clear with normal appearing TMs Cardiovascular: Regular rate and rhythm, no murmur.  Respiratory: Clear to auscultation Neurologic: Alert and oriented x 3   Assessment/Plan: Tonsil Abscess Plan for Procedure(s): TONSILLECTOMY   Dillard Cannon, MD 10/02/2011 9:02 AM

## 2011-10-02 NOTE — Progress Notes (Signed)
At approximately 5:45 the patient's heart rate dropped down to 37 but did not sustain.  She is currently running in the 40s-50s.  She complained of a headache that radiated to her upper back and from her back to her chest.  She stated this was normal for her.  MD notified but stated that since prior shift EKG was only SB and her labs were WNL that we just needed to monitor patient.  Report on episodes given to day nurse; patient will be monitored.

## 2011-10-02 NOTE — Anesthesia Preprocedure Evaluation (Addendum)
Anesthesia Evaluation  Patient identified by MRN, date of birth, ID band Patient awake    Reviewed: Allergy & Precautions, H&P , NPO status , Patient's Chart, lab work & pertinent test results  Airway Mallampati: I TM Distance: >3 FB Neck ROM: full    Dental   Pulmonary Current Smoker,          Cardiovascular Rhythm:regular Rate:Normal     Neuro/Psych    GI/Hepatic   Endo/Other    Renal/GU      Musculoskeletal   Abdominal   Peds  Hematology   Anesthesia Other Findings   Reproductive/Obstetrics Pt Denies any chance of pregnancy                          Anesthesia Physical Anesthesia Plan  ASA: II  Anesthesia Plan: General   Post-op Pain Management:    Induction: Intravenous  Airway Management Planned: Oral ETT  Additional Equipment:   Intra-op Plan:   Post-operative Plan: Extubation in OR  Informed Consent: I have reviewed the patients History and Physical, chart, labs and discussed the procedure including the risks, benefits and alternatives for the proposed anesthesia with the patient or authorized representative who has indicated his/her understanding and acceptance.     Plan Discussed with: CRNA, Anesthesiologist and Surgeon  Anesthesia Plan Comments:         Anesthesia Quick Evaluation

## 2011-10-02 NOTE — Progress Notes (Signed)
Nutrition Brief Note  Patient identified on the Nutrition Risk Report for problems chewing or swallowing. Pt admitted with sore throat and tonsillitis. Pt s/t tonsillectomy 8/12. Pt will likely require liquid diet with transition to soft foods. Pt may have some weight loss r/t decreased intake, some weight loss would be desirable for this pt given obesity. Pt does not have any weight loss PTA per assessment.    Body mass index is 30.97 kg/(m^2). Pt meets criteria for Obesity class 1 based on current BMI.   Current diet order is NPO post op. Labs and medications reviewed.   No nutrition interventions warranted at this time. If nutrition issues arise, please re-consult RD.   Clarene Duke RD, LDN Pager [phone] After Hours pager [phone]

## 2011-10-02 NOTE — Care Management Note (Signed)
    Page 1 of 1   10/02/2011     2:03:59 PM   CARE MANAGEMENT NOTE 10/02/2011  Patient:  Cheryl Simon, Cheryl Simon   Account Number:  [id number]  Date Initiated:  10/02/2011  Documentation initiated by:  Letha Cape  Subjective/Objective Assessment:   dx tonsilitis  admit as observation- lives with spouse.  pta independent.     Action/Plan:   Anticipated DC Date:  10/02/2011   Anticipated DC Plan:  HOME/SELF CARE      DC Planning Services  CM consult      Choice offered to / List presented to:             Status of service:  Completed, signed off Medicare Important Message given?   (If response is "NO", the following Medicare IM given date fields will be blank) Date Medicare IM given:   Date Additional Medicare IM given:    Discharge Disposition:  HOME/SELF CARE  Per UR Regulation:  Reviewed for med. necessity/level of care/duration of stay  If discussed at Long Length of Stay Meetings, dates discussed:    Comments:  10/02/11 14:02 Letha Cape RN, BSN [phone] patient lives with spouse, pta independent.  Patient is eligible for med ast if needed.  Patient has transportation and states she will get her meds from [facility].  No needs anticipated.

## 2011-10-02 NOTE — Discharge Summary (Signed)
Cheryl Simon: [id number] DOB/AGE: November 22, 1970 41 y.o.  Admit date: 10/01/2011 Discharge date: 10/02/2011  Primary Care Physician:  No primary provider on file.   Discharge Diagnoses:   Patient Active Problem List  Diagnosis  . Tonsillitis  . Tobacco abuse    DISCHARGE MEDICATION: Medication List  As of 10/02/2011 12:35 PM   TAKE these medications         amoxicillin 250 MG/5ML suspension   Commonly known as: AMOXIL   Take 10 mLs (500 mg total) by mouth 2 (two) times daily.      diphenhydrAMINE 25 mg capsule   Commonly known as: BENADRYL   Take 25 mg by mouth every 6 (six) hours as needed. allergies      HYDROcodone-acetaminophen 7.5-500 MG/15ML solution   Commonly known as: LORTAB   Take 15-20 mLs by mouth every 4 (four) hours as needed for pain.      ibuprofen 200 MG tablet   Commonly known as: ADVIL,MOTRIN   Take 600 mg by mouth every 6 (six) hours as needed. For pain/fever      NYQUIL PO   Take 30 mLs by mouth at bedtime as needed. Cold symptoms              Consults: Treatment Team:  Drema Halon, MD   SIGNIFICANT DIAGNOSTIC STUDIES:  Ct Soft Tissue Neck W Contrast  10/01/2011  *RADIOLOGY REPORT*  Clinical Data: Sore throat, evaluate for peritonsillar abscess. Fever, body aches.  CT NECK WITH CONTRAST  Technique:  Multidetector CT imaging of the neck was performed with intravenous contrast.  Contrast: 80mL OMNIPAQUE IOHEXOL 300 MG/ML  SOLN  Comparison: None.  Findings: Visualized intracranial contents are within normal limits.  Lung apices are clear.  Unremarkable nasopharynx and nasal cavity.  Bilateral tonsillar enlargement, left greater than right, with a 1 cm hypoattenuation along the anterolateral margin of the left tonsil as seen on series 3 image 41.  The tonsillar hypertrophy results in significant oral pharyngeal airway narrowing. Unremarkable oral cavity.  Associated lymphatic hypertrophy extends inferiorly along the anterior margin to the  level of the valleculae.  The epiglottis appears normal.  Unremarkable hypopharynx, larynx, and thyroid gland.  Normal caliber vasculature.  Symmetric parotid glands.  There is a prominent left anterior cervical chain lymph nodes measuring up to 1.3 cm short axis.  Submandibular gland on the left is mildly prominent however enhances homogeneous to the contralateral side.  No acute osseous finding.  IMPRESSION: Bilateral palatine tonsillar enlargement with phlegmon/developing peritonsillar abscess on the left measuring 1 cm.  There is continuous lymphatic hypertrophy extending inferior from the tonsils to the level of the valleculae. Likely infectious/inflammatory,  however recommend direct inspection to exclude mass.  Prominent left cervical chain lymph nodes, may be reactive.  Original Report Authenticated By: Waneta Martins, M.D.         OTHER PROCEDURES: Bilateral tonsillectomy on 10/02/2011  Recent Results (from the past 240 hour(s))  RAPID STREP SCREEN     Status: Normal   Collection Time   10/01/11 12:43 AM      Component Value Range Status Comment   Streptococcus, Group A Screen (Direct) NEGATIVE  NEGATIVE Final   SURGICAL PCR SCREEN     Status: Normal   Collection Time   10/01/11 10:07 PM      Component Value Range Status Comment   MRSA, PCR NEGATIVE  NEGATIVE Final    Staphylococcus aureus NEGATIVE  NEGATIVE Final     BRIEF ADMITTING H &  P: Cheryl Simon is an 41 y.o. female with benign PMH including s/p hysterectomy, presents to [facility] with sorethroat for the past three days. Her son also had sorethroat but did well. She has trouble swallowing due to pain, but has no drooling or trouble breathing. She denied fever, chills, nausea, vomitting, join aches, or any rash. She has no distant travel, and said she was at low risk for any STD. Evaluation in the ER at [facility] included a negative mono and negative rapid strept. She had a CT which showed bilateral palatine tonsillar enlargement with  phlegmon/developing peritonsillar abscess on the left measuring 1 cm. There is continuous lymphatic hypertrophy extending inferior from the tonsils to the level of the valleculae. EDP consulted Dr Ezzard Standing of ENT who would be graciously provide consultation. Hospitalist was asked to admit patient.    Hospital Course:  Present on Admission:  . Acute tonsillitis: The patient was admitted and started on IV clindamycin as well as prednisone. She was seen in consultation by Dr. Ezzard Standing at ENT. On today she was taken to the OR where she had bilateral tonsillectomy. Dr. Ezzard Standing has recommended that she continue amoxicillin as an outpatient and followup with him within 10-14 days. At the time of discharge the patient is stable. Her diet is as tolerated.  .Tobacco use disorder: The patient was counseled against further tobacco use. During hospitalization she received a nicotine patch   Disposition and Follow-up:  Patient is being discharged home. She is to followup with Dr. Ezzard Standing in 2 weeks.   DISCHARGE EXAM:   General: Patient postsurgical is alert and oriented x3. She is nontoxic-appearing  Vital signs:Blood pressure 163/76, pulse 46, temperature 97.4 F (36.3 C), temperature source Oral, resp. rate 20, height 5\' 7"  (1.702 m), weight 89.7 kg (197 lb 12 oz), SpO2 95.00%.  HEENT: oropharynx moist. Patient has no evidence of active bleeding   Lungs: Clear to auscultation  COR: S1-S2 normal no murmurs rubs or gallops noted  Abdomen: Soft nontender, nondistended, no masses no hepatosplenomegaly noted  Extremities: No clubbing cyanosis or edema. Skin warm and dry and capillary refill l< 3 seconds   Basename 10/02/11 0610 10/01/11 2033  NA 140 136  K 3.8 3.5  CL 108 103  CO2 24 25  GLUCOSE 135* 141*  BUN 6 6  CREATININE 0.57 0.57  CALCIUM 8.8 9.0  MG -- 2.1  PHOS -- --   No results found for this basename: AST:2,ALT:2,ALKPHOS:2,BILITOT:2,PROT:2,ALBUMIN:2 in the last 72 hours No results found  for this basename: LIPASE:2,AMYLASE:2 in the last 72 hours  Basename 10/02/11 0610 10/01/11 0814 10/01/11 0121  WBC 13.7* 11.0* --  NEUTROABS -- -- 7.3  HGB 11.7* 13.7 --  HCT 35.2* 39.4 --  MCV 90.7 89.5 --  PLT 230 277 --   Total time for discharge process including face-to-face time greater than 30 minutes  Signed: Manuel Lawhead A. 10/02/2011, 12:35 PM

## 2011-10-02 NOTE — Preoperative (Signed)
Beta Blockers   Reason not to administer Beta Blockers:Not Applicable 

## 2011-10-02 NOTE — OR Nursing (Signed)
Ring was given to [place] Njoroge-Secretary from [employer] 

## 2011-10-02 NOTE — Progress Notes (Signed)
Patient discharge teaching given, including activity, diet, follow-up appoints, and medications. Patient verbalized understanding of all discharge instructions. IV access was d/c'd. Vitals are stable. Skin is intact except as charted in most recent assessments. Pt to be escorted out by NT, to be driven home by family. 

## 2011-10-03 ENCOUNTER — Encounter (HOSPITAL_COMMUNITY): Payer: Self-pay | Admitting: Otolaryngology

## 2011-10-03 NOTE — Op Note (Signed)
NAMEKAREN, HUHTA               ACCOUNT NO.:  [id number]  MEDICAL RECORD NO.:  [id number]  LOCATION:  5508                         FACILITY:  [facility]  PHYSICIAN:  Kristine Garbe. Ezzard Standing, M.D.DATE OF BIRTH:  [birth year]  DATE OF PROCEDURE:  10/02/2011 DATE OF DISCHARGE:  10/02/2011                              OPERATIVE REPORT   PREOPERATIVE DIAGNOSIS:  History of recurrent tonsillitis with acute tonsillitis, left peritonsillar abscess.  POSTOPERATIVE DIAGNOSIS:  History of recurrent tonsillitis with acute tonsillitis, left peritonsillar abscess.  OPERATION:  Tonsillectomy.  SURGEON:  Kristine Garbe. Newman,MD.  ANESTHESIA:  General endotracheal.  COMPLICATIONS:  None.  BRIEF CLINICAL NOTE:  Cheryl Simon is a 41 year old female, who has always had large tonsils, and had history of tonsil infections in the past for which she has had taken numerous rounds of antibiotics, most recently developed a tonsil infection with very large tonsils and early left peritonsillar abscess.  Because of airway concerns, she was admitted for IV antibiotics and steroids and done better but still has enlarged tonsils with persistent left peritonsillar abscess.  She was taken to the operating room at that time for tonsillectomy.  She was taken to the operating room at that time emergently for left tonsillectomy.  DESCRIPTION OF PROCEDURE:  After adequate endotracheal anesthesia, the patient received 1 g Ancef IV preoperatively.  A mouth gag was used to expose the oropharynx.  The left tonsil was resected from the tonsillar fossa using the cautery.  The abscess was identified about the mid tonsillar region adjacent to the constrictor muscles.  There was a relatively small abscess 1-2 cm size.  Pus was obtained.  On removing the tonsil, the abscess was exposed and some of the tissue was cauterized for hemostasis.  Remaining tonsil was removed.  Next the right tonsil was removed with a cautery in  similar fashion.  The uvula and tonsillar pillars were preserved.  Hemostasis was obtained with cautery.  The oropharynx was irrigated with saline.  This completed the procedure.  Syrai was awoken from anesthesia, and transferred to recovery room, postop doing well.  DISPOSITION:  Discharged home later this morning on amoxicillin suspension 5 mg b.i.d. for 1 week, Tylenol and Lortab elixir 15-20 mL q.4 hours p.r.n. pain.  We will have her follow up in my office in 10-14 days for recheck.          ______________________________ Kristine Garbe. Ezzard Standing, M.D.     CEN/MEDQ  D:  10/02/2011  T:  10/03/2011  Job:  [id number]

## 2011-12-18 ENCOUNTER — Encounter (HOSPITAL_BASED_OUTPATIENT_CLINIC_OR_DEPARTMENT_OTHER): Payer: Self-pay | Admitting: *Deleted

## 2011-12-18 DIAGNOSIS — Y9389 Activity, other specified: Secondary | ICD-10-CM | POA: Insufficient documentation

## 2011-12-18 DIAGNOSIS — W108XXA Fall (on) (from) other stairs and steps, initial encounter: Secondary | ICD-10-CM | POA: Insufficient documentation

## 2011-12-18 DIAGNOSIS — S40029A Contusion of unspecified upper arm, initial encounter: Secondary | ICD-10-CM | POA: Insufficient documentation

## 2011-12-18 DIAGNOSIS — S93609A Unspecified sprain of unspecified foot, initial encounter: Secondary | ICD-10-CM | POA: Insufficient documentation

## 2011-12-18 DIAGNOSIS — F17200 Nicotine dependence, unspecified, uncomplicated: Secondary | ICD-10-CM | POA: Insufficient documentation

## 2011-12-18 DIAGNOSIS — Y9289 Other specified places as the place of occurrence of the external cause: Secondary | ICD-10-CM | POA: Insufficient documentation

## 2011-12-18 DIAGNOSIS — S8010XA Contusion of unspecified lower leg, initial encounter: Secondary | ICD-10-CM | POA: Insufficient documentation

## 2011-12-18 MED ORDER — HYDROCODONE-ACETAMINOPHEN 5-325 MG PO TABS
2.0000 | ORAL_TABLET | Freq: Once | ORAL | Status: AC
  Administered 2011-12-18: 2 via ORAL
  Filled 2011-12-18: qty 2

## 2011-12-18 NOTE — ED Notes (Signed)
Ice applied

## 2011-12-18 NOTE — ED Notes (Signed)
Pt reports she was on 5th step and took step to go downstairs and tripped- states both feet buckled under her and she jammed both big toes- also c/o pain to left arm- pulse present, can wiggle digits

## 2011-12-19 ENCOUNTER — Emergency Department (HOSPITAL_BASED_OUTPATIENT_CLINIC_OR_DEPARTMENT_OTHER): Payer: Self-pay

## 2011-12-19 ENCOUNTER — Emergency Department (HOSPITAL_BASED_OUTPATIENT_CLINIC_OR_DEPARTMENT_OTHER)
Admission: EM | Admit: 2011-12-19 | Discharge: 2011-12-19 | Disposition: A | Discharge status: Home / Self Care | Payer: Self-pay | Attending: Emergency Medicine | Admitting: Emergency Medicine

## 2011-12-19 ENCOUNTER — Emergency Department (HOSPITAL_BASED_OUTPATIENT_CLINIC_OR_DEPARTMENT_OTHER)
Admit: 2011-12-19 | Discharge: 2011-12-19 | Disposition: A | Discharge status: Home / Self Care | Payer: Self-pay | Attending: Emergency Medicine | Admitting: Emergency Medicine

## 2011-12-19 DIAGNOSIS — S93601A Unspecified sprain of right foot, initial encounter: Secondary | ICD-10-CM

## 2011-12-19 DIAGNOSIS — T07XXXA Unspecified multiple injuries, initial encounter: Secondary | ICD-10-CM

## 2011-12-19 MED ORDER — HYDROCODONE-ACETAMINOPHEN 5-325 MG PO TABS
2.0000 | ORAL_TABLET | Freq: Once | ORAL | Status: AC
  Administered 2011-12-19: 2 via ORAL
  Filled 2011-12-19: qty 2

## 2011-12-19 MED ORDER — HYDROCODONE-ACETAMINOPHEN 5-325 MG PO TABS: 1.0000 | ORAL_TABLET | Freq: Four times a day (QID) | ORAL | Status: DC | PRN

## 2011-12-19 NOTE — ED Provider Notes (Signed)
History     CSN: [id number]  Arrival date & time 12/18/11  2317   None     Chief Complaint  Patient presents with  . Fall    (Consider location/radiation/quality/duration/timing/severity/associated sxs/prior treatment) HPI This is a 41 year old female who slipped and fell down about 5 stairs yesterday evening. When she fell she hyperextended her both great toes and fell onto her left forearm. She is having moderate to severe pain in her right great toe radiating across the top of her right foot. She is having mild pain in her left foot. She is having moderate to severe pain in her left forearm. There is ecchymosis and pain of the left hand over the fifth metacarpal. Pain is worse with movement or palpation. She did not lose consciousness. She did not hit her head. She denies neck or back pain. Her left forearm was splinted in a cardboard splint for comfort prior to my evaluation.  History reviewed. No pertinent past medical history.  Past Surgical History  Procedure Date  . Abdominal hysterectomy   . Tonsillectomy 10/02/2011    Procedure: TONSILLECTOMY;  Surgeon: Drema Halon, MD;  Location: [facility] OR;  Service: ENT;  Laterality: Bilateral;  . Ectopic pregnancy surgery   . Novasure ablation     No family history on file.  History  Substance Use Topics  . Smoking status: Current Every Day Smoker -- 1.0 packs/day for 20 years    Types: Cigarettes  . Smokeless tobacco: Never Used  . Alcohol Use: 1.0 oz/week    2 drink(s) per week    OB History    Grav Para Term Preterm Abortions TAB SAB Ect Mult Living                  Review of Systems  All other systems reviewed and are negative.    Allergies  Dilaudid  Home Medications   Current Outpatient Rx  Name Route Sig Dispense Refill  . IBUPROFEN 200 MG PO TABS Oral Take 600 mg by mouth every 6 (six) hours as needed. For pain/fever    . DIPHENHYDRAMINE HCL 25 MG PO CAPS Oral Take 25 mg by mouth every 6 (six) hours  as needed. allergies    . NYQUIL PO Oral Take 30 mLs by mouth at bedtime as needed. Cold symptoms      BP 105/70  Pulse 67  Temp 98.3 F (36.8 C) (Oral)  Resp 20  SpO2 100%  Physical Exam General: Well-developed, well-nourished female in no acute distress; appearance consistent with age of record HENT: normocephalic, atraumatic Eyes: pupils equal round and reactive to light; extraocular muscles intact Neck: supple; nontender Heart: regular rate and rhythm Lungs: clear to auscultation bilaterally Abdomen: soft; nondistended; nontender Back: No spinal tenderness Extremities: No deformity; tenderness of right great toe without ecchymosis or swelling, decreased range of motion of right great toe due to pain, lesser tenderness over distal right foot; mild tenderness of left great toe with normal range of motion; no ankle tenderness or instability; tenderness of soft tissue of left forearm, moderate tenderness and ecchymosis overlying distal left fifth metacarpal; all limbs neurovascularly intact Neurologic: Awake, alert and oriented; motor function intact in all extremities and symmetric; no facial droop Skin: Warm and dry Psychiatric: Normal mood and affect    ED Course  Procedures (including critical care time)     MDM   Nursing notes and vitals signs, including pulse oximetry, reviewed.  Summary of this visit's results, reviewed by myself:  Imaging Studies: Dg Forearm Left  12/19/2011  *RADIOLOGY REPORT*  Clinical Data: Fall.  Pain.  LEFT FOREARM - 2 VIEW  Comparison: None.  Findings: The elbow and wrist joints are located.  No acute bone or soft tissue abnormality is evident.  IMPRESSION: Negative left forearm.   Original Report Authenticated By: Jamesetta Orleans. MATTERN, M.D.    Dg Hand Complete Left  12/19/2011  *RADIOLOGY REPORT*  Clinical Data: Injury.  Pain.  Status post fall.  LEFT HAND - COMPLETE 3+ VIEW  Comparison: None.  Findings: Imaged bones, joints and soft  tissues appear normal.  IMPRESSION: Negative exam.   Original Report Authenticated By: Bernadene Bell. D'ALESSIO, M.D.    Dg Foot Complete Left  12/19/2011  *RADIOLOGY REPORT*  Clinical Data: Fall.  Pain across the toes to the dorsum of the foot.  LEFT FOOT - COMPLETE 3+ VIEW  Comparison: None available.  Findings: No acute bone or soft tissue abnormality is present.  The joints are located.  IMPRESSION: Negative left foot.   Original Report Authenticated By: Jamesetta Orleans. MATTERN, M.D.    Dg Foot Complete Right  12/19/2011  *RADIOLOGY REPORT*  Clinical Data: Fall.  Pain extending to the right first metatarsal.  RIGHT FOOT COMPLETE - 3+ VIEW  Comparison: None.  Findings: A hallux valgus deformity is present.  No acute bone or soft tissue abnormality is present.  A small calcaneal spur is present.  IMPRESSION:  1.  Hallux valgus deformity. 2.  No acute abnormality.   Original Report Authenticated By: Jamesetta Orleans. MATTERN, M.D.             Hanley Seamen, MD 12/19/11 [phone]

## 2012-03-09 ENCOUNTER — Encounter (HOSPITAL_BASED_OUTPATIENT_CLINIC_OR_DEPARTMENT_OTHER): Payer: Self-pay | Admitting: *Deleted

## 2012-03-09 ENCOUNTER — Emergency Department (HOSPITAL_BASED_OUTPATIENT_CLINIC_OR_DEPARTMENT_OTHER)
Admission: EM | Admit: 2012-03-09 | Discharge: 2012-03-09 | Disposition: A | Discharge status: Home / Self Care | Payer: Self-pay | Attending: Emergency Medicine | Admitting: Emergency Medicine

## 2012-03-09 DIAGNOSIS — R0602 Shortness of breath: Secondary | ICD-10-CM | POA: Insufficient documentation

## 2012-03-09 DIAGNOSIS — R059 Cough, unspecified: Secondary | ICD-10-CM | POA: Insufficient documentation

## 2012-03-09 DIAGNOSIS — IMO0001 Reserved for inherently not codable concepts without codable children: Secondary | ICD-10-CM | POA: Insufficient documentation

## 2012-03-09 DIAGNOSIS — R509 Fever, unspecified: Secondary | ICD-10-CM | POA: Insufficient documentation

## 2012-03-09 DIAGNOSIS — R05 Cough: Secondary | ICD-10-CM | POA: Insufficient documentation

## 2012-03-09 DIAGNOSIS — R5381 Other malaise: Secondary | ICD-10-CM | POA: Insufficient documentation

## 2012-03-09 DIAGNOSIS — F17200 Nicotine dependence, unspecified, uncomplicated: Secondary | ICD-10-CM | POA: Insufficient documentation

## 2012-03-09 DIAGNOSIS — R6883 Chills (without fever): Secondary | ICD-10-CM | POA: Insufficient documentation

## 2012-03-09 MED ORDER — BENZONATATE 100 MG PO CAPS: 100.0000 mg | ORAL_CAPSULE | Freq: Three times a day (TID) | ORAL | Status: DC | PRN

## 2012-03-09 MED ORDER — ALBUTEROL SULFATE HFA 108 (90 BASE) MCG/ACT IN AERS: 1.0000 | INHALATION_SPRAY | Freq: Four times a day (QID) | RESPIRATORY_TRACT | Status: DC | PRN

## 2012-03-09 MED ORDER — NAPROXEN 500 MG PO TABS: 500.0000 mg | ORAL_TABLET | Freq: Two times a day (BID) | ORAL | Status: DC

## 2012-03-09 NOTE — ED Provider Notes (Signed)
History   Scribed for Celene Kras, MD, the patient was seen in room MH04/MH04 . This chart was scribed by Lewanda Rife.   CSN: [id number] Arrival date & time 03/09/12  1333 MD Initiated Contact with Patient 03/09/12 1549      Chief Complaint  Patient presents with  . Generalized Body Aches   HPI Cheryl Simon is a 42 y.o. female who presents to the Emergency Department complaining of constant moderate generalized myalgias onset 3 days. Pt states she feels fatigued and is sleeping most of the time. Pt reports husband at home has the flu. Pt denies congestion, rhinorrhea, fever, nausea, vomiting, and urinary symptoms. Pt reports shortness of breath, and dry cough. Pt states she is staying hydrated. Pt admits she smokes cigarettes. Pt reports ibuprofen moderately improves symptoms.  History reviewed. No pertinent past medical history.  Past Surgical History  Procedure Date  . Abdominal hysterectomy   . Tonsillectomy 10/02/2011    Procedure: TONSILLECTOMY;  Surgeon: Drema Halon, MD;  Location: [facility] OR;  Service: ENT;  Laterality: Bilateral;  . Ectopic pregnancy surgery   . Novasure ablation     History reviewed. No pertinent family history.  History  Substance Use Topics  . Smoking status: Current Every Day Smoker -- 1.0 packs/day for 20 years    Types: Cigarettes  . Smokeless tobacco: Never Used  . Alcohol Use: 1.0 oz/week    2 drink(s) per week    OB History    Grav Para Term Preterm Abortions TAB SAB Ect Mult Living                  Review of Systems  Constitutional: Positive for chills and fatigue. Negative for fever.  Respiratory: Positive for cough.   Cardiovascular: Negative.   Gastrointestinal: Negative.  Negative for vomiting and diarrhea.  Musculoskeletal: Positive for myalgias (generalized).  Skin: Negative.   Neurological: Positive for headaches.  Hematological: Negative.   Psychiatric/Behavioral: Negative.   All other systems reviewed and are  negative.    Allergies  Dilaudid  Home Medications   Current Outpatient Rx  Name  Route  Sig  Dispense  Refill  . DIPHENHYDRAMINE HCL 25 MG PO CAPS   Oral   Take 25 mg by mouth every 6 (six) hours as needed. allergies         . HYDROCODONE-ACETAMINOPHEN 5-325 MG PO TABS   Oral   Take 1-2 tablets by mouth every 6 (six) hours as needed for pain.   20 tablet   0   . IBUPROFEN 200 MG PO TABS   Oral   Take 600 mg by mouth every 6 (six) hours as needed. For pain/fever         . NYQUIL PO   Oral   Take 30 mLs by mouth at bedtime as needed. Cold symptoms           BP 124/73  Pulse 68  Temp 98.3 F (36.8 C) (Oral)  Resp 18  Ht 5\' 7"  (1.702 m)  Wt 205 lb (92.987 kg)  BMI 32.11 kg/m2  SpO2 99%  Physical Exam  Nursing note and vitals reviewed. Constitutional: She appears well-developed and well-nourished. No distress.  HENT:  Head: Normocephalic and atraumatic.  Right Ear: External ear normal.  Left Ear: External ear normal.  Eyes: Conjunctivae normal are normal. Right eye exhibits no discharge. Left eye exhibits no discharge. No scleral icterus.  Neck: Neck supple. No tracheal deviation present.  Cardiovascular: Normal rate, regular rhythm  and intact distal pulses.   Pulmonary/Chest: Effort normal and breath sounds normal. No stridor. No respiratory distress. She has no wheezes. She has no rales.  Abdominal: Soft. Bowel sounds are normal. She exhibits no distension. There is no tenderness. There is no rebound and no guarding.  Musculoskeletal: She exhibits no edema and no tenderness.  Neurological: She is alert. She has normal strength. No sensory deficit. Cranial nerve deficit:  no gross defecits noted. She exhibits normal muscle tone. She displays no seizure activity. Coordination normal.  Skin: Skin is warm and dry. No rash noted.  Psychiatric: She has a normal mood and affect.    ED Course  Procedures (including critical care time)  Labs Reviewed - No data  to display No results found.   1. Influenza-like illness       MDM  Consistent with influenza.  Doubt PNA.  No wheezing noted on exam.  Vitals normal.  Will dc home with supportive care.  Follow up with PCP, sooner if not improving as expected.      I personally performed the services described in this documentation, which was scribed in my presence. The recorded information has been reviewed and is accurate.    Celene Kras, MD 03/09/12 [id number]

## 2012-03-09 NOTE — ED Notes (Signed)
Pt evaluated by EDP Linwood Dibbles and d/c home prior to RN assessment

## 2012-03-09 NOTE — ED Notes (Signed)
Family members have had flu and pt is having s/s as well since Thursday (H/A, body aches, sleeping a lot)

## 2013-01-30 ENCOUNTER — Emergency Department (HOSPITAL_BASED_OUTPATIENT_CLINIC_OR_DEPARTMENT_OTHER)
Admission: EM | Admit: 2013-01-30 | Discharge: 2013-01-30 | Disposition: A | Discharge status: Home / Self Care | Payer: BC Managed Care – PPO | Attending: Emergency Medicine | Admitting: Emergency Medicine

## 2013-01-30 ENCOUNTER — Encounter (HOSPITAL_BASED_OUTPATIENT_CLINIC_OR_DEPARTMENT_OTHER): Payer: Self-pay | Admitting: Emergency Medicine

## 2013-01-30 DIAGNOSIS — H1010 Acute atopic conjunctivitis, unspecified eye: Secondary | ICD-10-CM | POA: Diagnosis present

## 2013-01-30 DIAGNOSIS — J309 Allergic rhinitis, unspecified: Secondary | ICD-10-CM | POA: Insufficient documentation

## 2013-01-30 DIAGNOSIS — F17200 Nicotine dependence, unspecified, uncomplicated: Secondary | ICD-10-CM | POA: Insufficient documentation

## 2013-01-30 DIAGNOSIS — Z79899 Other long term (current) drug therapy: Secondary | ICD-10-CM | POA: Insufficient documentation

## 2013-01-30 DIAGNOSIS — H1011 Acute atopic conjunctivitis, right eye: Secondary | ICD-10-CM

## 2013-01-30 DIAGNOSIS — H1045 Other chronic allergic conjunctivitis: Secondary | ICD-10-CM | POA: Insufficient documentation

## 2013-01-30 MED ORDER — TETRACAINE HCL 0.5 % OP SOLN
OPHTHALMIC | Status: AC
  Administered 2013-01-30: 2 [drp] via OPHTHALMIC
  Filled 2013-01-30: qty 2

## 2013-01-30 MED ORDER — TETRACAINE HCL 0.5 % OP SOLN
2.0000 [drp] | Freq: Once | OPHTHALMIC | Status: AC
  Administered 2013-01-30: 2 [drp] via OPHTHALMIC

## 2013-01-30 MED ORDER — FLUORESCEIN SODIUM 1 MG OP STRP
ORAL_STRIP | OPHTHALMIC | Status: AC
  Administered 2013-01-30: 1 via OPHTHALMIC
  Filled 2013-01-30: qty 1

## 2013-01-30 MED ORDER — FLUORESCEIN SODIUM 1 MG OP STRP
1.0000 | ORAL_STRIP | Freq: Once | OPHTHALMIC | Status: AC
  Administered 2013-01-30: 1 via OPHTHALMIC

## 2013-01-30 MED ORDER — LORATADINE 10 MG PO TABS
10.0000 mg | ORAL_TABLET | Freq: Every day | ORAL | Status: DC
  Administered 2013-01-30: 10 mg via ORAL
  Filled 2013-01-30: qty 1

## 2013-01-30 NOTE — ED Notes (Signed)
Was cleaning vents at work yesterday eyes became irritated throughout the day woke up this morning and eye was crusted over right eye continues to be red and swollen

## 2013-01-30 NOTE — ED Provider Notes (Signed)
CSN: [id number]     Arrival date & time 01/30/13  1046 History   First MD Initiated Contact with Patient 01/30/13 1100     Chief Complaint  Patient presents with  . eye irritation    (Consider location/radiation/quality/duration/timing/severity/associated sxs/prior Treatment) Patient is a 42 y.o. female presenting with conjunctivitis. The history is provided by the patient.  Conjunctivitis This is a new problem. The current episode started yesterday. The problem occurs constantly. The problem has been gradually improving. Pertinent negatives include no chest pain, no abdominal pain, no headaches and no shortness of breath. Nothing aggravates the symptoms. Relieved by: lubricating eye gtts. Treatments tried: eye gtts. The treatment provided moderate relief.    History reviewed. No pertinent past medical history. Past Surgical History  Procedure Laterality Date  . Abdominal hysterectomy    . Tonsillectomy  10/02/2011    Procedure: TONSILLECTOMY;  Surgeon: Drema Halon, MD;  Location: [facility] OR;  Service: ENT;  Laterality: Bilateral;  . Ectopic pregnancy surgery    . Novasure ablation     History reviewed. No pertinent family history. History  Substance Use Topics  . Smoking status: Current Every Day Smoker -- 1.00 packs/day for 20 years    Types: Cigarettes  . Smokeless tobacco: Never Used  . Alcohol Use: 1.0 oz/week    2 drink(s) per week   OB History   Grav Para Term Preterm Abortions TAB SAB Ect Mult Living                 Review of Systems  Constitutional: Negative for fever and fatigue.  HENT: Negative for congestion and drooling.   Eyes: Positive for itching. Negative for photophobia, pain, discharge and visual disturbance.  Respiratory: Negative for cough and shortness of breath.   Cardiovascular: Negative for chest pain.  Gastrointestinal: Negative for nausea, vomiting, abdominal pain and diarrhea.  Genitourinary: Negative for dysuria and hematuria.    Musculoskeletal: Negative for back pain, gait problem and neck pain.  Skin: Negative for color change.  Neurological: Negative for dizziness and headaches.  Hematological: Negative for adenopathy.  Psychiatric/Behavioral: Negative for behavioral problems.  All other systems reviewed and are negative.    Allergies  Dilaudid  Home Medications   Current Outpatient Rx  Name  Route  Sig  Dispense  Refill  . albuterol (PROVENTIL HFA;VENTOLIN HFA) 108 (90 BASE) MCG/ACT inhaler   Inhalation   Inhale 1-2 puffs into the lungs every 6 (six) hours as needed for wheezing.   1 Inhaler   0   . benzonatate (TESSALON) 100 MG capsule   Oral   Take 1 capsule (100 mg total) by mouth 3 (three) times daily as needed for cough.   21 capsule   0   . diphenhydrAMINE (BENADRYL) 25 mg capsule   Oral   Take 25 mg by mouth every 6 (six) hours as needed. allergies         . HYDROcodone-acetaminophen (NORCO/VICODIN) 5-325 MG per tablet   Oral   Take 1-2 tablets by mouth every 6 (six) hours as needed for pain.   20 tablet   0   . naproxen (NAPROSYN) 500 MG tablet   Oral   Take 1 tablet (500 mg total) by mouth 2 (two) times daily.   30 tablet   0   . Pseudoeph-Doxylamine-DM-APAP (NYQUIL PO)   Oral   Take 30 mLs by mouth at bedtime as needed. Cold symptoms          BP 116/67  Pulse  63  Temp(Src) 98.3 F (36.8 C)  Resp 20  Ht 5\' 7"  (1.702 m)  Wt 198 lb 4 oz (89.926 kg)  BMI 31.04 kg/m2  SpO2 100% Physical Exam  Nursing note and vitals reviewed. Constitutional: She is oriented to person, place, and time. She appears well-developed and well-nourished.  HENT:  Head: Normocephalic.  Mouth/Throat: Oropharynx is clear and moist. No oropharyngeal exudate.  20/20 vision bilaterally. Mild nasal congestion.   Eyes: EOM are normal. Pupils are equal, round, and reactive to light. Right conjunctiva is injected (mild).    Neck: Normal range of motion. Neck supple.  Cardiovascular: Normal  rate, regular rhythm, normal heart sounds and intact distal pulses.  Exam reveals no gallop and no friction rub.   No murmur heard. Pulmonary/Chest: Effort normal and breath sounds normal. No respiratory distress. She has no wheezes.  Abdominal: Soft. Bowel sounds are normal. There is no tenderness. There is no rebound and no guarding.  Musculoskeletal: Normal range of motion. She exhibits no edema and no tenderness.  Neurological: She is alert and oriented to person, place, and time.  Skin: Skin is warm and dry.  Psychiatric: She has a normal mood and affect. Her behavior is normal.    ED Course  Procedures (including critical care time) Labs Review Labs Reviewed - No data to display Imaging Review No results found.  EKG Interpretation   None       MDM   1. Allergic conjunctivitis and rhinitis, right    11:16 AM 42 y.o. female who presents with mild right eye pain, tearing, and rhinorrhea. The patient states that she was cleaning dust off of a vent yesterday and believes she got some of the dust in her right eye. She has had mild irritation and tearing since then. She denies any change in vision or photophobia. She had mild crusting of the eyelashes this morning. She is afebrile and vital signs are unremarkable here. No evidence of any corneal abrasion upon standing. Will recommend symptomatic therapy. Will get a Claritin here. 20/20 vision bilaterally on my exam.   11:17 AM:  I have discussed the diagnosis/risks/treatment options with the patient and believe the pt to be eligible for discharge home to follow-up with pcp as needed. We also discussed returning to the ED immediately if new or worsening sx occur. We discussed the sx which are most concerning (e.g., worsening eye pain, change in vision, fever) that necessitate immediate return. Any new prescriptions provided to the patient are listed below.  New Prescriptions   No medications on file       Junius Argyle,  MD 01/30/13 1121

## 2013-03-18 ENCOUNTER — Emergency Department (HOSPITAL_BASED_OUTPATIENT_CLINIC_OR_DEPARTMENT_OTHER)
Admission: EM | Admit: 2013-03-18 | Discharge: 2013-03-18 | Discharge status: Against Medical Advice | Payer: BC Managed Care – PPO | Attending: Emergency Medicine | Admitting: Emergency Medicine

## 2013-03-18 ENCOUNTER — Encounter (HOSPITAL_BASED_OUTPATIENT_CLINIC_OR_DEPARTMENT_OTHER): Payer: Self-pay | Admitting: Emergency Medicine

## 2013-03-18 DIAGNOSIS — R0789 Other chest pain: Secondary | ICD-10-CM | POA: Insufficient documentation

## 2013-03-18 DIAGNOSIS — F17200 Nicotine dependence, unspecified, uncomplicated: Secondary | ICD-10-CM | POA: Insufficient documentation

## 2013-03-18 NOTE — ED Notes (Signed)
C/o CP x 2 days-states feels like "Squeezing"-also c/o dry cough

## 2013-06-04 ENCOUNTER — Emergency Department (HOSPITAL_BASED_OUTPATIENT_CLINIC_OR_DEPARTMENT_OTHER)
Admission: EM | Admit: 2013-06-04 | Discharge: 2013-06-04 | Disposition: A | Discharge status: Home / Self Care | Payer: BC Managed Care – PPO | Attending: Emergency Medicine | Admitting: Emergency Medicine

## 2013-06-04 ENCOUNTER — Encounter (HOSPITAL_BASED_OUTPATIENT_CLINIC_OR_DEPARTMENT_OTHER): Payer: Self-pay | Admitting: Emergency Medicine

## 2013-06-04 DIAGNOSIS — R3919 Other difficulties with micturition: Secondary | ICD-10-CM | POA: Insufficient documentation

## 2013-06-04 DIAGNOSIS — R110 Nausea: Secondary | ICD-10-CM | POA: Insufficient documentation

## 2013-06-04 DIAGNOSIS — Z9089 Acquired absence of other organs: Secondary | ICD-10-CM | POA: Insufficient documentation

## 2013-06-04 DIAGNOSIS — F17200 Nicotine dependence, unspecified, uncomplicated: Secondary | ICD-10-CM | POA: Insufficient documentation

## 2013-06-04 DIAGNOSIS — Z791 Long term (current) use of non-steroidal anti-inflammatories (NSAID): Secondary | ICD-10-CM | POA: Insufficient documentation

## 2013-06-04 DIAGNOSIS — R300 Dysuria: Secondary | ICD-10-CM

## 2013-06-04 DIAGNOSIS — R109 Unspecified abdominal pain: Secondary | ICD-10-CM | POA: Insufficient documentation

## 2013-06-04 DIAGNOSIS — R350 Frequency of micturition: Secondary | ICD-10-CM | POA: Insufficient documentation

## 2013-06-04 DIAGNOSIS — Z3202 Encounter for pregnancy test, result negative: Secondary | ICD-10-CM | POA: Insufficient documentation

## 2013-06-04 LAB — URINALYSIS, ROUTINE W REFLEX MICROSCOPIC
Bilirubin Urine: NEGATIVE
Glucose, UA: NEGATIVE mg/dL
Hgb urine dipstick: NEGATIVE
Ketones, ur: NEGATIVE mg/dL
LEUKOCYTES UA: NEGATIVE
NITRITE: NEGATIVE
PROTEIN: NEGATIVE mg/dL
Specific Gravity, Urine: 1.021 (ref 1.005–1.030)
UROBILINOGEN UA: 0.2 mg/dL (ref 0.0–1.0)
pH: 7.5 (ref 5.0–8.0)

## 2013-06-04 LAB — WET PREP, GENITAL
CLUE CELLS WET PREP: NONE SEEN
Trich, Wet Prep: NONE SEEN
YEAST WET PREP: NONE SEEN

## 2013-06-04 LAB — PREGNANCY, URINE: PREG TEST UR: NEGATIVE

## 2013-06-04 MED ORDER — CIPROFLOXACIN HCL 500 MG PO TABS: 500.0000 mg | ORAL_TABLET | Freq: Two times a day (BID) | ORAL | Status: DC

## 2013-06-04 NOTE — Discharge Instructions (Signed)

## 2013-06-04 NOTE — ED Provider Notes (Signed)
CSN: [id number]     Arrival date & time 06/04/13  2120 History  This chart was scribed for Cheryl BuccoMelanie Pama Roskos, MD by Ellin MayhewMichael Levi, ED Scribe. This patient was seen in room MH10/MH10 and the patient's care was started at 10:18 PM.  Patient is a 43 y.o. female presenting with back pain. The history is provided by the patient. No language interpreter was used.  Back Pain Location:  Lumbar spine Quality:  Cramping Radiates to:  L posterior upper leg and R posterior upper leg Pain severity:  Moderate Duration:  1 week Timing:  Intermittent Progression:  Worsening Chronicity:  New Associated symptoms: abdominal pain and dysuria   Associated symptoms: no chest pain, no fever, no headaches, no numbness and no weakness    HPI Comments: Cheryl Simon is a 43 y.o. female who presents to the Emergency Department with a chief complaint of low back pain with onset 1 week. She describes her back pain as a cramping sensation which shoots to her hips, bilaterally.  Patient states the pain intermittent and has been progressively worsening. When the pain is present, patient reports symptoms of nausea w/o vomiting.  Additionally, patient reports having dysuria, foul smelling/cloudy urine, irregular frequency/volume, and mild lower abdominal pain (pressure) with onset one month. Patient reports symptoms have constantly changed, initially beginning as dysuria, which self-resolved, and progressed to foul-smelling urine. Approximately one week ago, patient reports that the odor had resolved; however, that she experienced irregular frequency, with one episode of abnormally large urine output. Patient denies any vaginal bleeding or vaginal discharge. Patient denies any other recent illnesses.  History reviewed. No pertinent past medical history. Past Surgical History  Procedure Laterality Date  . Abdominal hysterectomy    . Tonsillectomy  10/02/2011    Procedure: TONSILLECTOMY;  Surgeon: Drema Halonhristopher E Newman, MD;   [facility];  Service: ENT;  Laterality: Bilateral;  . Ectopic pregnancy surgery    . Novasure ablation     History reviewed. No pertinent family history. History  Substance Use Topics  . Smoking status: Current Every Day Smoker -- 1.00 packs/day for 20 years    Types: Cigarettes  . Smokeless tobacco: Never Used  . Alcohol Use: 1.0 oz/week    2 drink(s) per week     Comment: socially    OB History   Grav Para Term Preterm Abortions TAB SAB Ect Mult Living                 Review of Systems  Constitutional: Negative for fever, chills, diaphoresis and fatigue.  HENT: Negative for congestion, rhinorrhea and sneezing.   Eyes: Negative.   Respiratory: Negative for cough, chest tightness and shortness of breath.   Cardiovascular: Negative for chest pain and leg swelling.  Gastrointestinal: Positive for nausea and abdominal pain. Negative for vomiting, diarrhea and blood in stool.  Genitourinary: Positive for dysuria, frequency and difficulty urinating. Negative for hematuria, flank pain, vaginal bleeding, vaginal discharge and vaginal pain.  Musculoskeletal: Negative for arthralgias and back pain.  Skin: Negative for rash.  Neurological: Negative for dizziness, speech difficulty, weakness, numbness and headaches.   Allergies  Dilaudid  Home Medications   Prior to Admission medications   Medication Sig Start Date End Date Taking? Authorizing Provider  albuterol (PROVENTIL HFA;VENTOLIN HFA) 108 (90 BASE) MCG/ACT inhaler Inhale 1-2 puffs into the lungs every 6 (six) hours as needed for wheezing. 03/09/12   Celene KrasJon R Knapp, MD  benzonatate (TESSALON) 100 MG capsule Take 1 capsule (100 mg total) by  mouth 3 (three) times daily as needed for cough. 03/09/12   Celene KrasJon R Knapp, MD  diphenhydrAMINE (BENADRYL) 25 mg capsule Take 25 mg by mouth every 6 (six) hours as needed. allergies    Historical Provider, MD  HYDROcodone-acetaminophen (NORCO/VICODIN) 5-325 MG per tablet Take 1-2 tablets by mouth  every 6 (six) hours as needed for pain. 12/19/11   John L Molpus, MD  naproxen (NAPROSYN) 500 MG tablet Take 1 tablet (500 mg total) by mouth 2 (two) times daily. 03/09/12   Celene KrasJon R Knapp, MD  Pseudoeph-Doxylamine-DM-APAP (NYQUIL PO) Take 30 mLs by mouth at bedtime as needed. Cold symptoms    Historical Provider, MD   Triage Vitals: BP 143/76  Pulse 70  Temp(Src) 98.5 F (36.9 C) (Oral)  Resp 20  Ht 5\' 6"  (1.676 m)  Wt 192 lb (87.091 kg)  BMI 31.00 kg/m2  SpO2 99%  Physical Exam  Constitutional: She is oriented to person, place, and time. She appears well-developed and well-nourished.  HENT:  Head: Normocephalic and atraumatic.  Eyes: Pupils are equal, round, and reactive to light.  Neck: Normal range of motion. Neck supple.  Cardiovascular: Normal rate, regular rhythm and normal heart sounds.   Pulmonary/Chest: Effort normal and breath sounds normal. No respiratory distress. She has no wheezes. She has no rales. She exhibits no tenderness.  Abdominal: Soft. Bowel sounds are normal. There is no tenderness. There is no CVA tenderness.  Genitourinary: No vaginal discharge found.  No CMT, adnexal tenderness  Musculoskeletal: Normal range of motion. She exhibits no edema.  Lymphadenopathy:    She has no cervical adenopathy.  Neurological: She is alert and oriented to person, place, and time.  Skin: Skin is warm and dry. No rash noted.  Psychiatric: She has a normal mood and affect.    ED Course  Procedures (including critical care time)  DIAGNOSTIC STUDIES: Oxygen Saturation is 99% on room air, normal by my interpretation.    COORDINATION OF CARE: 10:25 PM-Treatment plan discussed with patient and patient agrees.  Labs Review Labs Reviewed  WET PREP, GENITAL - Abnormal; Notable for the following:    WBC, Wet Prep HPF POC FEW (*)    All other components within normal limits  URINALYSIS, ROUTINE W REFLEX MICROSCOPIC - Abnormal; Notable for the following:    APPearance CLOUDY  (*)    All other components within normal limits  URINE CULTURE  GC/CHLAMYDIA PROBE AMP  PREGNANCY, URINE   Imaging Review No results found.   EKG Interpretation None      MDM   Final diagnoses:  Dysuria   Patient presents with dysuria and foul-smelling urine. Her pelvic was unremarkable. Her urine was also unremarkable but she's had recurrent UTIs and given her symptoms I went ahead and treat her with antibiotics pending the culture. Advised to followup with her primary care physician or return here if she has any worsening symptoms.  I personally performed the services described in this documentation, which was scribed in my presence.  The recorded information has been reviewed and considered.    Cheryl BuccoMelanie Kryssa Risenhoover, MD 06/05/13 0002

## 2013-06-04 NOTE — ED Notes (Signed)
Pt reports lower back pain, lower abdominal pressure, cloudy urine, reports urine has a foul odor. States home treatment for for UTI s/s one month ago.

## 2013-06-05 LAB — GC/CHLAMYDIA PROBE AMP
CT Probe RNA: NEGATIVE
GC Probe RNA: NEGATIVE

## 2013-06-07 LAB — URINE CULTURE
Colony Count: >=100000
Special Requests: NORMAL

## 2013-06-08 ENCOUNTER — Telehealth (HOSPITAL_BASED_OUTPATIENT_CLINIC_OR_DEPARTMENT_OTHER): Payer: Self-pay | Admitting: Emergency Medicine

## 2013-06-08 NOTE — Telephone Encounter (Signed)
Post ED Visit - Positive Culture Follow-up  Culture report reviewed by antimicrobial stewardship pharmacist: [x]  Wes Dulaney, Pharm.D., BCPS []  Celedonio MiyamotoJeremy Frens, Pharm.D., BCPS []  Georgina PillionElizabeth Martin, Pharm.D., BCPS []  LoyalMinh Pham, [address]Pharm[address]D., BCPS, AAHIVP []  Estella HuskMichelle Turner, Pharm.D., BCPS, AAHIVP []  Harvie JuniorNathan Cope, Pharm.D.  Positive urine culture Treated with Cipro, organism sensitive to the same and no further patient follow-up is required at this time.  Azie Mcconahy 06/08/2013, 6:00 PM

## 2013-12-30 ENCOUNTER — Emergency Department (HOSPITAL_BASED_OUTPATIENT_CLINIC_OR_DEPARTMENT_OTHER): Payer: BC Managed Care – PPO

## 2013-12-30 ENCOUNTER — Emergency Department (HOSPITAL_BASED_OUTPATIENT_CLINIC_OR_DEPARTMENT_OTHER)
Admission: EM | Admit: 2013-12-30 | Discharge: 2013-12-30 | Disposition: A | Discharge status: Home / Self Care | Payer: BC Managed Care – PPO | Attending: Emergency Medicine | Admitting: Emergency Medicine

## 2013-12-30 ENCOUNTER — Encounter (HOSPITAL_BASED_OUTPATIENT_CLINIC_OR_DEPARTMENT_OTHER): Payer: Self-pay | Admitting: Emergency Medicine

## 2013-12-30 DIAGNOSIS — R1011 Right upper quadrant pain: Secondary | ICD-10-CM | POA: Insufficient documentation

## 2013-12-30 DIAGNOSIS — Z90710 Acquired absence of both cervix and uterus: Secondary | ICD-10-CM | POA: Diagnosis not present

## 2013-12-30 DIAGNOSIS — Z720 Tobacco use: Secondary | ICD-10-CM | POA: Insufficient documentation

## 2013-12-30 LAB — URINALYSIS, ROUTINE W REFLEX MICROSCOPIC
Bilirubin Urine: NEGATIVE
GLUCOSE, UA: NEGATIVE mg/dL
Hgb urine dipstick: NEGATIVE
KETONES UR: NEGATIVE mg/dL
LEUKOCYTES UA: NEGATIVE
NITRITE: NEGATIVE
PH: 8.5 — AB (ref 5.0–8.0)
Protein, ur: NEGATIVE mg/dL
SPECIFIC GRAVITY, URINE: 1.011 (ref 1.005–1.030)
Urobilinogen, UA: 0.2 mg/dL (ref 0.0–1.0)

## 2013-12-30 LAB — TROPONIN I

## 2013-12-30 LAB — COMPREHENSIVE METABOLIC PANEL
ALK PHOS: 88 U/L (ref 39–117)
ALT: 52 U/L — ABNORMAL HIGH (ref 0–35)
AST: 37 U/L (ref 0–37)
Albumin: 3.9 g/dL (ref 3.5–5.2)
Anion gap: 12 (ref 5–15)
BILIRUBIN TOTAL: 0.7 mg/dL (ref 0.3–1.2)
BUN: 8 mg/dL (ref 6–23)
CO2: 24 mEq/L (ref 19–32)
Calcium: 9.6 mg/dL (ref 8.4–10.5)
Chloride: 103 mEq/L (ref 96–112)
Creatinine, Ser: 0.6 mg/dL (ref 0.50–1.10)
GFR calc Af Amer: >90 mL/min (ref >90)
GLUCOSE: 107 mg/dL — AB (ref 70–99)
POTASSIUM: 4.1 meq/L (ref 3.7–5.3)
Sodium: 139 mEq/L (ref 137–147)
TOTAL PROTEIN: 7.9 g/dL (ref 6.0–8.3)

## 2013-12-30 LAB — CBC WITH DIFFERENTIAL/PLATELET
BASOS ABS: 0.1 10*3/uL (ref 0.0–0.1)
BASOS PCT: 1 % (ref 0–1)
Eosinophils Absolute: 0.2 10*3/uL (ref 0.0–0.7)
Eosinophils Relative: 3 % (ref 0–5)
HCT: 39.4 % (ref 36.0–46.0)
Hemoglobin: 13.5 g/dL (ref 12.0–15.0)
Lymphocytes Relative: 31 % (ref 12–46)
Lymphs Abs: 2.1 10*3/uL (ref 0.7–4.0)
MCH: 30.8 pg (ref 26.0–34.0)
MCHC: 34.3 g/dL (ref 30.0–36.0)
MCV: 90 fL (ref 78.0–100.0)
Monocytes Absolute: 0.5 10*3/uL (ref 0.1–1.0)
Monocytes Relative: 8 % (ref 3–12)
NEUTROS ABS: 3.9 10*3/uL (ref 1.7–7.7)
NEUTROS PCT: 57 % (ref 43–77)
PLATELETS: 283 10*3/uL (ref 150–400)
RBC: 4.38 MIL/uL (ref 3.87–5.11)
RDW: 13.1 % (ref 11.5–15.5)
WBC: 6.7 10*3/uL (ref 4.0–10.5)

## 2013-12-30 LAB — LIPASE, BLOOD: LIPASE: 23 U/L (ref 11–59)

## 2013-12-30 MED ORDER — OXYCODONE-ACETAMINOPHEN 5-325 MG PO TABS: 1.0000 | ORAL_TABLET | Freq: Four times a day (QID) | ORAL | Status: DC | PRN

## 2013-12-30 MED ORDER — ONDANSETRON HCL 4 MG/2ML IJ SOLN
INTRAMUSCULAR | Status: AC
  Filled 2013-12-30: qty 2

## 2013-12-30 MED ORDER — SODIUM CHLORIDE 0.9 % IV BOLUS (SEPSIS)
1000.0000 mL | INTRAVENOUS | Status: AC
  Administered 2013-12-30: 1000 mL via INTRAVENOUS

## 2013-12-30 MED ORDER — SODIUM CHLORIDE 0.9 % IV SOLN: 20.0000 mL | INTRAVENOUS | Status: DC

## 2013-12-30 MED ORDER — MORPHINE SULFATE 4 MG/ML IJ SOLN
4.0000 mg | Freq: Once | INTRAMUSCULAR | Status: AC
  Administered 2013-12-30: 4 mg via INTRAVENOUS
  Filled 2013-12-30: qty 1

## 2013-12-30 MED ORDER — ONDANSETRON HCL 4 MG/2ML IJ SOLN
4.0000 mg | Freq: Once | INTRAMUSCULAR | Status: AC
  Administered 2013-12-30: 4 mg via INTRAVENOUS
  Filled 2013-12-30: qty 2

## 2013-12-30 NOTE — ED Notes (Signed)
Pt having RUQ abdominal pain since Sunday, worse today.  Pain radiates to back and up shoulders.  Some diarrhea.

## 2013-12-30 NOTE — Discharge Instructions (Signed)
Abdominal Pain, Women °Abdominal (stomach, pelvic, or belly) pain can be caused by many things. It is important to tell your doctor: °· The location of the pain. °· Does it come and go or is it present all the time? °· Are there things that start the pain (eating certain foods, exercise)? °· Are there other symptoms associated with the pain (fever, nausea, vomiting, diarrhea)? °All of this is helpful to know when trying to find the cause of the pain. °CAUSES  °· Stomach: virus or bacteria infection, or ulcer. °· Intestine: appendicitis (inflamed appendix), regional ileitis (Crohn's disease), ulcerative colitis (inflamed colon), irritable bowel syndrome, diverticulitis (inflamed diverticulum of the colon), or cancer of the stomach or intestine. °· Gallbladder disease or stones in the gallbladder. °· Kidney disease, kidney stones, or infection. °· Pancreas infection or cancer. °· Fibromyalgia (pain disorder). °· Diseases of the female organs: °¨ Uterus: fibroid (non-cancerous) tumors or infection. °¨ Fallopian tubes: infection or tubal pregnancy. °¨ Ovary: cysts or tumors. °¨ Pelvic adhesions (scar tissue). °¨ Endometriosis (uterus lining tissue growing in the pelvis and on the pelvic organs). °¨ Pelvic congestion syndrome (female organs filling up with blood just before the menstrual period). °¨ Pain with the menstrual period. °¨ Pain with ovulation (producing an egg). °¨ Pain with an IUD (intrauterine device, birth control) in the uterus. °¨ Cancer of the female organs. °· Functional pain (pain not caused by a disease, may improve without treatment). °· Psychological pain. °· Depression. °DIAGNOSIS  °Your doctor will decide the seriousness of your pain by doing an examination. °· Blood tests. °· X-rays. °· Ultrasound. °· CT scan (computed tomography, special type of X-ray). °· MRI (magnetic resonance imaging). °· Cultures, for infection. °· Barium enema (dye inserted in the large intestine, to better view it with  X-rays). °· Colonoscopy (looking in intestine with a lighted tube). °· Laparoscopy (minor surgery, looking in abdomen with a lighted tube). °· Major abdominal exploratory surgery (looking in abdomen with a large incision). °TREATMENT  °The treatment will depend on the cause of the pain.  °· Many cases can be observed and treated at home. °· Over-the-counter medicines recommended by your caregiver. °· Prescription medicine. °· Antibiotics, for infection. °· Birth control pills, for painful periods or for ovulation pain. °· Hormone treatment, for endometriosis. °· Nerve blocking injections. °· Physical therapy. °· Antidepressants. °· Counseling with a psychologist or psychiatrist. °· Minor or major surgery. °HOME CARE INSTRUCTIONS  °· Do not take laxatives, unless directed by your caregiver. °· Take over-the-counter pain medicine only if ordered by your caregiver. Do not take aspirin because it can cause an upset stomach or bleeding. °· Try a clear liquid diet (broth or water) as ordered by your caregiver. Slowly move to a bland diet, as tolerated, if the pain is related to the stomach or intestine. °· Have a thermometer and take your temperature several times a day, and record it. °· Bed rest and sleep, if it helps the pain. °· Avoid sexual intercourse, if it causes pain. °· Avoid stressful situations. °· Keep your follow-up appointments and tests, as your caregiver orders. °· If the pain does not go away with medicine or surgery, you may try: °¨ Acupuncture. °¨ Relaxation exercises (yoga, meditation). °¨ Group therapy. °¨ Counseling. °SEEK MEDICAL CARE IF:  °· You notice certain foods cause stomach pain. °· Your home care treatment is not helping your pain. °· You need stronger pain medicine. °· You want your IUD removed. °· You feel faint or   lightheaded. °· You develop nausea and vomiting. °· You develop a rash. °· You are having side effects or an allergy to your medicine. °SEEK IMMEDIATE MEDICAL CARE IF:  °· Your  pain does not go away or gets worse. °· You have a fever. °· Your pain is felt only in portions of the abdomen. The right side could possibly be appendicitis. The left lower portion of the abdomen could be colitis or diverticulitis. °· You are passing blood in your stools (bright red or black tarry stools, with or without vomiting). °· You have blood in your urine. °· You develop chills, with or without a fever. °· You pass out. °MAKE SURE YOU:  °· Understand these instructions. °· Will watch your condition. °· Will get help right away if you are not doing well or get worse. °Document Released: 12/04/2006 Document Revised: 06/23/2013 Document Reviewed: 12/24/2008 °[employer]® Patient Information ©2015 [employer]. This information is not intended to replace advice given to you by your health care provider. Make sure you discuss any questions you have with your health care provider. ° °

## 2013-12-30 NOTE — ED Provider Notes (Signed)
CSN: [id number]     Arrival date & time 12/30/13  0909 History   First MD Initiated Contact with Patient 12/30/13 [phone]     Chief Complaint  Patient presents with  . Abdominal Pain     (Consider location/radiation/quality/duration/timing/severity/associated sxs/prior Treatment) Patient is a 43 y.o. female presenting with abdominal pain. The history is provided by the patient.  Abdominal Pain Pain location:  RUQ Pain quality: aching   Pain radiates to:  Does not radiate Pain severity:  Moderate Onset quality:  Gradual Duration:  2 days Timing:  Intermittent Progression:  Worsening Chronicity:  Recurrent Context comment:  Eating Relieved by:  Nothing Worsened by:  Nothing tried Ineffective treatments:  None tried Associated symptoms: no chest pain, no cough, no diarrhea, no dysuria, no fatigue, no fever, no hematuria, no nausea, no shortness of breath and no vomiting     No past medical history on file. Past Surgical History  Procedure Laterality Date  . Abdominal hysterectomy    . Tonsillectomy  10/02/2011    Procedure: TONSILLECTOMY;  Surgeon: Drema Halonhristopher E Newman, MD;  [facility];  Service: ENT;  Laterality: Bilateral;  . Ectopic pregnancy surgery    . Novasure ablation    . Tonsillectomy     No family history on file. History  Substance Use Topics  . Smoking status: Current Every Day Smoker -- 1.00 packs/day for 20 years    Types: Cigarettes  . Smokeless tobacco: Never Used  . Alcohol Use: 1.0 oz/week    2 Not specified per week     Comment: socially    OB History    No data available     Review of Systems  Constitutional: Negative for fever and fatigue.  HENT: Negative for congestion and drooling.   Eyes: Negative for pain.  Respiratory: Negative for cough and shortness of breath.   Cardiovascular: Negative for chest pain.  Gastrointestinal: Positive for abdominal pain. Negative for nausea, vomiting and diarrhea.  Genitourinary: Negative for dysuria  and hematuria.  Musculoskeletal: Negative for back pain, gait problem and neck pain.  Skin: Negative for color change.  Neurological: Negative for dizziness and headaches.  Hematological: Negative for adenopathy.  Psychiatric/Behavioral: Negative for behavioral problems.  All other systems reviewed and are negative.     Allergies  Dilaudid  Home Medications   Prior to Admission medications   Not on File   BP 120/73 mmHg  Pulse 59  Temp(Src) 97.9 F (36.6 C) (Oral)  Resp 24  Ht 5\' 7"  (1.702 m)  Wt 194 lb (87.998 kg)  BMI 30.38 kg/m2  SpO2 100% Physical Exam  Constitutional: She is oriented to person, place, and time. She appears well-developed and well-nourished.  HENT:  Head: Normocephalic and atraumatic.  Mouth/Throat: Oropharynx is clear and moist. No oropharyngeal exudate.  Eyes: Conjunctivae and EOM are normal. Pupils are equal, round, and reactive to light.  Neck: Normal range of motion. Neck supple.  Cardiovascular: Normal rate, regular rhythm, normal heart sounds and intact distal pulses.  Exam reveals no gallop and no friction rub.   No murmur heard. Pulmonary/Chest: Effort normal and breath sounds normal. No respiratory distress. She has no wheezes.  Abdominal: Soft. Bowel sounds are normal. Distention: moderate ttp of RUQ. There is tenderness. There is no rebound and no guarding.  Musculoskeletal: Normal range of motion. She exhibits no edema or tenderness.  Neurological: She is alert and oriented to person, place, and time.  Skin: Skin is warm and dry.  Psychiatric: She  has a normal mood and affect. Her behavior is normal.  Nursing note and vitals reviewed.   ED Course  Procedures (including critical care time) Labs Review Labs Reviewed  COMPREHENSIVE METABOLIC PANEL - Abnormal; Notable for the following:    Glucose, Bld 107 (*)    ALT 52 (*)    All other components within normal limits  URINALYSIS, ROUTINE W REFLEX MICROSCOPIC - Abnormal; Notable for  the following:    pH 8.5 (*)    All other components within normal limits  LIPASE, BLOOD  CBC WITH DIFFERENTIAL  TROPONIN I    Imaging Review [place] Abdomen Complete  12/30/2013   CLINICAL DATA:  43 year old female with right upper quadrant pain extending to right flank and chest with diarrhea for the past 2 days. Similar at stools multiple times over several years. Initial encounter.  EXAM: ULTRASOUND ABDOMEN COMPLETE  COMPARISON:  None.  FINDINGS: Gallbladder: No gallstones or wall thickening visualized. No sonographic Murphy sign noted.  Common bile duct: Diameter: 2 mm. Distal aspect not visualized secondary to bowel gas  Liver: No focal lesion identified. Minimal increased echogenicity may represent very mild fatty infiltration.  IVC: Poorly delineated secondary to bowel gas.  Pancreas: Poorly delineated secondary to bowel gas.  Spleen: Size and appearance within normal limits.  Right Kidney: Length: 11.7 cm. Echogenicity within normal limits. No mass or hydronephrosis visualized.  Left Kidney: Length: 11.9 cm. Echogenicity within normal limits. No mass or hydronephrosis visualized.  Abdominal aorta: No aneurysm visualized.  Other findings: None.  IMPRESSION: No sonographic findings cholecystitis.  Question mild fatty infiltration of the liver.  Evaluation of the pancreas, inferior vena cava, portions of the abdominal aorta and the distal aspect of the common bile duct are limited secondary to bowel gas.   Electronically Signed   By: Bridgett LarssonSteve  Olson M.D.   On: 12/30/2013 11:05     EKG Interpretation   Date/Time:  Tuesday December 30 2013 11:36:18 EST Ventricular Rate:  45 PR Interval:  162 QRS Duration: 98 QT Interval:  488 QTC Calculation: 422 R Axis:   72 Text Interpretation:  Sinus bradycardia Otherwise normal ECG No  significant change since last tracing Confirmed by Delia Slatten  MD, Tiquan Bouch  ([id number]) on 12/30/2013 3:06:32 PM      MDM   Final diagnoses:  RUQ pain    9:56 AM 43 y.o.  female with a history of hysterectomy who presents with right upper quadrant pain over the last 2 days. She notes she has had several episodes of this pain, worse with eating over the last 2-3 years. She notes significant worsening since she ate beer cheese soup yesterday for lunch. She denies any fevers, vomiting. She has had some mild diarrhea. She is afebrile and vital signs are unremarkable here. We'll get imaging and labs.   Ecg and trop later added d/t pt's early hx of heart disease as a screening tool given non-contrib workup thus far. Pt has no cp/sob.    1:05 PM: I interpreted/reviewed the labs and/or imaging which were non-contributory. Sx improved w/ tx. I offered CT, pt would prefer to go home w/ sx control.  I have discussed the diagnosis/risks/treatment options with the patient and family and believe the pt to be eligible for discharge home to follow-up with surgery as outpt. We also discussed returning to the ED immediately if new or worsening sx occur. We discussed the sx which are most concerning (e.g., worsening pain, fever, vomiting) that necessitate immediate return. Medications administered to  the patient during their visit and any new prescriptions provided to the patient are listed below.  Medications given during this visit Medications  sodium chloride 0.9 % bolus 1,000 mL (0 mLs Intravenous Stopped 12/30/13 1305)  morphine 4 MG/ML injection 4 mg (4 mg Intravenous Given 12/30/13 1025)  ondansetron (ZOFRAN) injection 4 mg (4 mg Intravenous Given 12/30/13 1025)  morphine 4 MG/ML injection 4 mg (4 mg Intravenous Given 12/30/13 1119)    New Prescriptions   No medications on file     Purvis Sheffield, MD 12/30/13 2116

## 2014-01-13 ENCOUNTER — Other Ambulatory Visit (INDEPENDENT_AMBULATORY_CARE_PROVIDER_SITE_OTHER): Payer: Self-pay

## 2014-01-13 ENCOUNTER — Telehealth (INDEPENDENT_AMBULATORY_CARE_PROVIDER_SITE_OTHER): Payer: Self-pay

## 2014-01-13 DIAGNOSIS — R109 Unspecified abdominal pain: Secondary | ICD-10-CM

## 2014-01-13 NOTE — Telephone Encounter (Signed)
Called and spoke to patient to make aware that Dr. Derrell LollingIngram would like for patient to have a HIDA Scan w/CCK prior to office visit in December.  Patient verbalized understanding and will await a call from our office with appt date & time for test.

## 2014-01-23 ENCOUNTER — Other Ambulatory Visit (INDEPENDENT_AMBULATORY_CARE_PROVIDER_SITE_OTHER): Payer: Self-pay | Admitting: General Surgery

## 2014-01-23 ENCOUNTER — Ambulatory Visit (HOSPITAL_COMMUNITY)
Admission: RE | Admit: 2014-01-23 | Discharge: 2014-01-23 | Disposition: A | Discharge status: Home / Self Care | Payer: BC Managed Care – PPO | Source: Ambulatory Visit | Attending: General Surgery | Admitting: General Surgery

## 2014-01-23 DIAGNOSIS — R109 Unspecified abdominal pain: Secondary | ICD-10-CM

## 2014-01-23 DIAGNOSIS — R1011 Right upper quadrant pain: Secondary | ICD-10-CM | POA: Insufficient documentation

## 2014-01-23 MED ORDER — SINCALIDE 5 MCG IJ SOLR
0.0200 ug/kg | Freq: Once | INTRAMUSCULAR | Status: AC
  Administered 2014-01-23: 11:00:00 via INTRAVENOUS

## 2014-01-23 MED ORDER — TECHNETIUM TC 99M MEBROFENIN IV KIT
5.1000 | PACK | Freq: Once | INTRAVENOUS | Status: AC | PRN
  Administered 2014-01-23: 5 via INTRAVENOUS

## 2015-03-03 ENCOUNTER — Emergency Department (HOSPITAL_BASED_OUTPATIENT_CLINIC_OR_DEPARTMENT_OTHER): Payer: Self-pay

## 2015-03-03 ENCOUNTER — Encounter (HOSPITAL_BASED_OUTPATIENT_CLINIC_OR_DEPARTMENT_OTHER): Payer: Self-pay | Admitting: Emergency Medicine

## 2015-03-03 ENCOUNTER — Emergency Department (HOSPITAL_BASED_OUTPATIENT_CLINIC_OR_DEPARTMENT_OTHER)
Admission: EM | Admit: 2015-03-03 | Discharge: 2015-03-03 | Disposition: A | Discharge status: Home / Self Care | Payer: Self-pay | Attending: Physician Assistant | Admitting: Physician Assistant

## 2015-03-03 DIAGNOSIS — Z90710 Acquired absence of both cervix and uterus: Secondary | ICD-10-CM | POA: Insufficient documentation

## 2015-03-03 DIAGNOSIS — R109 Unspecified abdominal pain: Secondary | ICD-10-CM | POA: Insufficient documentation

## 2015-03-03 DIAGNOSIS — R3915 Urgency of urination: Secondary | ICD-10-CM | POA: Insufficient documentation

## 2015-03-03 DIAGNOSIS — F17210 Nicotine dependence, cigarettes, uncomplicated: Secondary | ICD-10-CM | POA: Insufficient documentation

## 2015-03-03 DIAGNOSIS — B349 Viral infection, unspecified: Secondary | ICD-10-CM | POA: Insufficient documentation

## 2015-03-03 LAB — CBC WITH DIFFERENTIAL/PLATELET
Basophils Absolute: 0.1 10*3/uL (ref 0.0–0.1)
Basophils Relative: 1 %
EOS PCT: 5 %
Eosinophils Absolute: 0.4 10*3/uL (ref 0.0–0.7)
HCT: 41.1 % (ref 36.0–46.0)
HEMOGLOBIN: 13.6 g/dL (ref 12.0–15.0)
Lymphocytes Relative: 34 %
Lymphs Abs: 2.8 10*3/uL (ref 0.7–4.0)
MCH: 29.8 pg (ref 26.0–34.0)
MCHC: 33.1 g/dL (ref 30.0–36.0)
MCV: 90.1 fL (ref 78.0–100.0)
MONOS PCT: 4 %
Monocytes Absolute: 0.3 10*3/uL (ref 0.1–1.0)
Neutro Abs: 4.5 10*3/uL (ref 1.7–7.7)
Neutrophils Relative %: 56 %
Platelets: 311 10*3/uL (ref 150–400)
RBC: 4.56 MIL/uL (ref 3.87–5.11)
RDW: 12.5 % (ref 11.5–15.5)
WBC: 8.1 10*3/uL (ref 4.0–10.5)

## 2015-03-03 LAB — COMPREHENSIVE METABOLIC PANEL
ALBUMIN: 4 g/dL (ref 3.5–5.0)
ALK PHOS: 64 U/L (ref 38–126)
ALT: 44 U/L (ref 14–54)
AST: 30 U/L (ref 15–41)
Anion gap: 7 (ref 5–15)
BUN: 9 mg/dL (ref 6–20)
CALCIUM: 9.1 mg/dL (ref 8.9–10.3)
CO2: 26 mmol/L (ref 22–32)
Chloride: 105 mmol/L (ref 101–111)
Creatinine, Ser: 0.72 mg/dL (ref 0.44–1.00)
GFR calc Af Amer: >60 mL/min (ref >60)
GFR calc non Af Amer: >60 mL/min (ref >60)
GLUCOSE: 96 mg/dL (ref 65–99)
POTASSIUM: 3.5 mmol/L (ref 3.5–5.1)
Sodium: 138 mmol/L (ref 135–145)
Total Bilirubin: 0.5 mg/dL (ref 0.3–1.2)
Total Protein: 8 g/dL (ref 6.5–8.1)

## 2015-03-03 LAB — URINE MICROSCOPIC-ADD ON

## 2015-03-03 LAB — URINALYSIS, ROUTINE W REFLEX MICROSCOPIC
Bilirubin Urine: NEGATIVE
GLUCOSE, UA: NEGATIVE mg/dL
Hgb urine dipstick: NEGATIVE
Ketones, ur: NEGATIVE mg/dL
Nitrite: NEGATIVE
PH: 7 (ref 5.0–8.0)
Protein, ur: NEGATIVE mg/dL
Specific Gravity, Urine: 1.015 (ref 1.005–1.030)

## 2015-03-03 MED ORDER — SODIUM CHLORIDE 0.9 % IV BOLUS (SEPSIS): 1000.0000 mL | Freq: Once | INTRAVENOUS | Status: DC

## 2015-03-03 MED ORDER — CEPHALEXIN 500 MG PO CAPS: 500.0000 mg | ORAL_CAPSULE | Freq: Three times a day (TID) | ORAL | Status: DC

## 2015-03-03 MED ORDER — IBUPROFEN 800 MG PO TABS: 800.0000 mg | ORAL_TABLET | Freq: Three times a day (TID) | ORAL | Status: DC

## 2015-03-03 MED ORDER — ONDANSETRON HCL 4 MG/2ML IJ SOLN
4.0000 mg | Freq: Once | INTRAMUSCULAR | Status: AC
  Administered 2015-03-03: 4 mg via INTRAVENOUS
  Filled 2015-03-03: qty 2

## 2015-03-03 MED ORDER — KETOROLAC TROMETHAMINE 30 MG/ML IJ SOLN
15.0000 mg | Freq: Once | INTRAMUSCULAR | Status: AC
  Administered 2015-03-03: 15 mg via INTRAVENOUS
  Filled 2015-03-03: qty 1

## 2015-03-03 MED ORDER — CEPHALEXIN 250 MG PO CAPS
500.0000 mg | ORAL_CAPSULE | Freq: Once | ORAL | Status: AC
  Administered 2015-03-03: 500 mg via ORAL
  Filled 2015-03-03: qty 2

## 2015-03-03 MED ORDER — ONDANSETRON HCL 4 MG PO TABS: 4.0000 mg | ORAL_TABLET | Freq: Three times a day (TID) | ORAL | Status: DC | PRN

## 2015-03-03 MED ORDER — SODIUM CHLORIDE 0.9 % IV BOLUS (SEPSIS)
1000.0000 mL | Freq: Once | INTRAVENOUS | Status: AC
  Administered 2015-03-03: 1000 mL via INTRAVENOUS

## 2015-03-03 NOTE — Discharge Instructions (Signed)
We ar eunsure what is causing your discomfort today.  We think it could be the flu.  We are treating you for a urinary tract infection just in case. We want you to follow up with your PCP tomorrow or the next day. Please return wth fever, abdominal pains or other concerning symtpoms.

## 2015-03-03 NOTE — ED Provider Notes (Signed)
CSN: [id number]     Arrival date & time 03/03/15  1318 History   First MD Initiated Contact with Patient 03/03/15 1507     Chief Complaint  Patient presents with  . Flank Pain     (Consider location/radiation/quality/duration/timing/severity/associated sxs/prior Treatment) HPI   Patient is a 45 year old female with a little past medical history presenting today with right flank pain. She reports this been going on for 3-4 days. She reports it was all the way across her back and then moved her her right hand side radiating into her right groin. She's had partial hysterectomy in the past. She's had mild nausea. No vomiting. No diarrhea. Patient says that she feels like she maybe has the flu. She feels that her urine is smelly and darker urine than usual.  She denies any fever. She states she has been trying to drink a lot of fluid to "flush her system".  History reviewed. No pertinent past medical history. Past Surgical History  Procedure Laterality Date  . Abdominal hysterectomy    . Tonsillectomy  10/02/2011    Procedure: TONSILLECTOMY;  Surgeon: Drema Halonhristopher E Newman, MD;  [facility];  Service: ENT;  Laterality: Bilateral;  . Ectopic pregnancy surgery    . Novasure ablation    . Tonsillectomy     History reviewed. No pertinent family history. Social History  Substance Use Topics  . Smoking status: Current Every Day Smoker -- 1.00 packs/day for 20 years    Types: Cigarettes  . Smokeless tobacco: Never Used  . Alcohol Use: 1.0 oz/week    2 Standard drinks or equivalent per week     Comment: socially    OB History    No data available     Review of Systems  Constitutional: Positive for fatigue. Negative for activity change.  HENT: Positive for congestion.   Eyes: Negative for discharge.  Respiratory: Negative for cough and chest tightness.   Cardiovascular: Negative for chest pain and palpitations.  Gastrointestinal: Positive for abdominal pain. Negative for nausea,  diarrhea, constipation and abdominal distention.  Genitourinary: Positive for urgency and flank pain. Negative for difficulty urinating.  Musculoskeletal: Negative for joint swelling.  Skin: Negative for rash.  Allergic/Immunologic: Negative for immunocompromised state.  Neurological: Negative for seizures.  Psychiatric/Behavioral: Negative for agitation.      Allergies  Dilaudid  Home Medications   Prior to Admission medications   Medication Sig Start Date End Date Taking? Authorizing Provider  oxyCODONE-acetaminophen (PERCOCET) 5-325 MG per tablet Take 1 tablet by mouth every 6 (six) hours as needed for moderate pain. 12/30/13   Purvis SheffieldForrest Harrison, MD   BP 103/43 mmHg  Pulse 54  Temp(Src) 98.5 F (36.9 C) (Oral)  Resp 18  Ht 5\' 6"  (1.676 m)  Wt 200 lb (90.719 kg)  BMI 32.30 kg/m2  SpO2 98% Physical Exam  Constitutional: She is oriented to person, place, and time. She appears well-developed and well-nourished.  HENT:  Head: Normocephalic and atraumatic.  Eyes: Conjunctivae are normal. Right eye exhibits no discharge.  Neck: Neck supple.  Cardiovascular: Normal rate, regular rhythm and normal heart sounds.   No murmur heard. Pulmonary/Chest: Effort normal and breath sounds normal. She has no wheezes. She has no rales.  Abdominal: Soft. She exhibits no distension. There is no tenderness.  R CVA tenderness  No abdominal tenderness  Musculoskeletal: Normal range of motion. She exhibits no edema.  Neurological: She is oriented to person, place, and time. No cranial nerve deficit.  Skin: Skin is warm  and dry. No rash noted. She is not diaphoretic.  Psychiatric: She has a normal mood and affect.  Nursing note and vitals reviewed.   ED Course  Procedures (including critical care time) Labs Review Labs Reviewed  URINALYSIS, ROUTINE W REFLEX MICROSCOPIC (NOT AT [facility]) - Abnormal; Notable for the following:    APPearance CLOUDY (*)    Leukocytes, UA SMALL (*)    All other  components within normal limits  URINE MICROSCOPIC-ADD ON - Abnormal; Notable for the following:    Squamous Epithelial / LPF 6-30 (*)    Bacteria, UA MANY (*)    All other components within normal limits  CBC WITH DIFFERENTIAL/PLATELET  COMPREHENSIVE METABOLIC PANEL    Imaging Review Ct Renal Stone Study  03/03/2015  CLINICAL DATA:  Right flank pain and nausea beginning earlier today. EXAM: CT ABDOMEN AND PELVIS WITHOUT CONTRAST TECHNIQUE: Multidetector CT imaging of the abdomen and pelvis was performed following the standard protocol without IV contrast. COMPARISON:  None. FINDINGS: Lower chest:  No acute findings. Hepatobiliary: No mass visualized on this un-enhanced exam. Gallbladder is unremarkable. Pancreas: No mass or inflammatory process identified on this un-enhanced exam. Spleen: Within normal limits in size. Adrenals/Urinary Tract: No evidence of urolithiasis or hydronephrosis. No definite mass visualized on this un-enhanced exam. Stomach/Bowel: No evidence of obstruction, inflammatory process, or abnormal fluid collections. Normal appendix visualized. Vascular/Lymphatic: No pathologically enlarged lymph nodes. No evidence of abdominal aortic aneurysm. Reproductive: No mass or other significant abnormality. Other: None. Musculoskeletal: No suspicious bone lesions identified. Probable small bone island bone island seen within the left pelvis and thoracolumbar spine. IMPRESSION: No evidence of urolithiasis, hydronephrosis, or other acute findings. Electronically Signed   By: Myles Rosenthal M.D.   On: 03/03/2015 15:52   I have personally reviewed and evaluated these images and lab results as part of my medical decision-making.   EKG Interpretation None      MDM   Final diagnoses:  Right flank pain    Patient is a 45 year old previously healthy female presenting with right CVA tenderness radiating to her right groin. Patient signs and symptoms are consistent with the pyelonephritis  versus kidney stone. However her urine does not show evidence of urinary tract infection. We will get labs, give fluids, get CT noncontrast rule out stone. Patient also states she's been feeling poorly and feels like she might have the flu. We'll give her ketorolac to help with her pain.  5:50 PM Labs and vital signs are all wnl.  No fever, taking PO without issue, no tachycardia.  She had clinically what sounds like a UTI, so I will treat today.  She does not have any more pain, still feels "like a train hit me".  I think she likley has the flu.  We will have her follow up with PCP tomorrow.  STrict return precautions given since I am unsure what is causing her to feel poorly.   Cache Decoursey Randall An, MD 03/03/15 1751

## 2015-03-03 NOTE — ED Notes (Signed)
Pt in c/o R flank pain onset yesterday but worse today. Has noticed foul smelling urine for 4-5 days. Afebrile, NAD.

## 2015-04-02 NOTE — Progress Notes (Signed)
This encounter was created in error - please disregard.

## 2016-09-19 ENCOUNTER — Emergency Department (HOSPITAL_BASED_OUTPATIENT_CLINIC_OR_DEPARTMENT_OTHER)
Admission: EM | Admit: 2016-09-19 | Discharge: 2016-09-19 | Disposition: A | Discharge status: Home / Self Care | Payer: Self-pay | Attending: Emergency Medicine | Admitting: Emergency Medicine

## 2016-09-19 ENCOUNTER — Encounter (HOSPITAL_BASED_OUTPATIENT_CLINIC_OR_DEPARTMENT_OTHER): Payer: Self-pay | Admitting: Emergency Medicine

## 2016-09-19 DIAGNOSIS — M791 Myalgia, unspecified site: Secondary | ICD-10-CM

## 2016-09-19 DIAGNOSIS — R197 Diarrhea, unspecified: Secondary | ICD-10-CM | POA: Insufficient documentation

## 2016-09-19 DIAGNOSIS — R519 Headache, unspecified: Secondary | ICD-10-CM

## 2016-09-19 DIAGNOSIS — R112 Nausea with vomiting, unspecified: Secondary | ICD-10-CM | POA: Insufficient documentation

## 2016-09-19 DIAGNOSIS — R1031 Right lower quadrant pain: Secondary | ICD-10-CM | POA: Insufficient documentation

## 2016-09-19 DIAGNOSIS — F17210 Nicotine dependence, cigarettes, uncomplicated: Secondary | ICD-10-CM | POA: Insufficient documentation

## 2016-09-19 DIAGNOSIS — R51 Headache: Secondary | ICD-10-CM | POA: Insufficient documentation

## 2016-09-19 LAB — URINALYSIS, ROUTINE W REFLEX MICROSCOPIC
Bilirubin Urine: NEGATIVE
GLUCOSE, UA: NEGATIVE mg/dL
Hgb urine dipstick: NEGATIVE
KETONES UR: NEGATIVE mg/dL
LEUKOCYTES UA: NEGATIVE
Nitrite: NEGATIVE
PROTEIN: NEGATIVE mg/dL
Specific Gravity, Urine: 1.023 (ref 1.005–1.030)
pH: 7 (ref 5.0–8.0)

## 2016-09-19 LAB — COMPREHENSIVE METABOLIC PANEL
ALK PHOS: 74 U/L (ref 38–126)
ALT: 25 U/L (ref 14–54)
AST: 23 U/L (ref 15–41)
Albumin: 3.7 g/dL (ref 3.5–5.0)
Anion gap: 9 (ref 5–15)
BILIRUBIN TOTAL: 0.6 mg/dL (ref 0.3–1.2)
BUN: 8 mg/dL (ref 6–20)
CALCIUM: 8.9 mg/dL (ref 8.9–10.3)
CHLORIDE: 104 mmol/L (ref 101–111)
CO2: 24 mmol/L (ref 22–32)
CREATININE: 0.69 mg/dL (ref 0.44–1.00)
Glucose, Bld: 95 mg/dL (ref 65–99)
Potassium: 3.6 mmol/L (ref 3.5–5.1)
Sodium: 137 mmol/L (ref 135–145)
TOTAL PROTEIN: 7.2 g/dL (ref 6.5–8.1)

## 2016-09-19 LAB — CBC
HCT: 36.8 % (ref 36.0–46.0)
Hemoglobin: 12.9 g/dL (ref 12.0–15.0)
MCH: 31.3 pg (ref 26.0–34.0)
MCHC: 35.1 g/dL (ref 30.0–36.0)
MCV: 89.3 fL (ref 78.0–100.0)
PLATELETS: 253 10*3/uL (ref 150–400)
RBC: 4.12 MIL/uL (ref 3.87–5.11)
RDW: 12.7 % (ref 11.5–15.5)
WBC: 7.3 10*3/uL (ref 4.0–10.5)

## 2016-09-19 LAB — RAPID URINE DRUG SCREEN, HOSP PERFORMED
Amphetamines: NOT DETECTED
BARBITURATES: NOT DETECTED
Benzodiazepines: NOT DETECTED
COCAINE: NOT DETECTED
Opiates: NOT DETECTED
Tetrahydrocannabinol: POSITIVE — AB

## 2016-09-19 LAB — LIPASE, BLOOD: Lipase: 21 U/L (ref 11–51)

## 2016-09-19 LAB — PREGNANCY, URINE: PREG TEST UR: NEGATIVE

## 2016-09-19 MED ORDER — SODIUM CHLORIDE 0.9 % IV BOLUS (SEPSIS)
1000.0000 mL | Freq: Once | INTRAVENOUS | Status: AC
  Administered 2016-09-19: 1000 mL via INTRAVENOUS

## 2016-09-19 MED ORDER — PANTOPRAZOLE SODIUM 20 MG PO TBEC: 20.0000 mg | DELAYED_RELEASE_TABLET | Freq: Every day | ORAL | 0 refills | Status: DC

## 2016-09-19 MED ORDER — ACETAMINOPHEN 500 MG PO TABS: 1000.0000 mg | ORAL_TABLET | Freq: Four times a day (QID) | ORAL | 0 refills | Status: DC | PRN

## 2016-09-19 MED ORDER — ONDANSETRON 4 MG PO TBDP: 4.0000 mg | ORAL_TABLET | ORAL | 0 refills | Status: DC | PRN

## 2016-09-19 MED ORDER — ONDANSETRON HCL 4 MG/2ML IJ SOLN
4.0000 mg | Freq: Once | INTRAMUSCULAR | Status: AC | PRN
  Administered 2016-09-19: 4 mg via INTRAVENOUS
  Filled 2016-09-19: qty 2

## 2016-09-19 MED ORDER — KETOROLAC TROMETHAMINE 30 MG/ML IJ SOLN
30.0000 mg | Freq: Once | INTRAMUSCULAR | Status: AC
  Administered 2016-09-19: 30 mg via INTRAVENOUS
  Filled 2016-09-19: qty 1

## 2016-09-19 MED ORDER — PANTOPRAZOLE SODIUM 40 MG IV SOLR
40.0000 mg | Freq: Once | INTRAVENOUS | Status: AC
  Administered 2016-09-19: 40 mg via INTRAVENOUS
  Filled 2016-09-19: qty 40

## 2016-09-19 NOTE — ED Notes (Signed)
Pt given cup to attempt urine sample. 

## 2016-09-19 NOTE — ED Notes (Signed)
Ass[facility] care of [facility] from [facility]. Pt resting quietly. No distress. NO complaints. Pt resting quietly. Significant other at bedside. IV fluid continues to infuse as ordered.

## 2016-09-19 NOTE — ED Provider Notes (Signed)
[facility]   CSN: [id number] Arrival date & time: 09/19/16  1806     History   Chief Complaint Chief Complaint  Patient presents with  . Emesis    HPI Cheryl Simon is a 46 y.o. female.  HPI Patient reports she felt all right over the weekend. She went to [place] and saw a show. She reports she ate A salad Sunday night. Monday she awakened with copious and recurrent vomiting. She reports she threw up to she can start anything else. She reports that she's felt exhausted and unable to barely make it out of bed to the bathroom and back. Ports initially she had a few episodes of bowel movement. She reports it was a normal bowel movement than some diarrhea and then no more. She does reports she's been having some right sided abdominal pain. She reports it feels like there is "glass" in her abdomen. She is not sure if it's from all the episodes of throwing up. She reports that she also has a headache. She reports is a generalized throbbing headache. She reports some mild light sensitivity. She has not had documented fever but she has had hot and cold episodes and night sweats. She reports she had a UTI about 6 months ago. She does state over the past week or so she has noted that her urine "smelled funny". It was similar to when she had a UTI. She denies however that she's had pain burning or urgency. She reports she had an odd episode on Friday where she very suddenly started to feel very bad but it all passed and she felt fine again. Patient denies drug use. She reports occasional marijuana use. She denies IV drug abuse. She denies alcohol use. She denies any tick bites or rashes that she is noted. History reviewed. No pertinent past medical history.  Patient Active Problem List   Diagnosis Date Noted  . 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 Tonsillitis 10/01/2011  . Tobacco abuse 10/01/2011    Past Surgical History:  Procedure Laterality Date  .  ABDOMINAL HYSTERECTOMY    . ECTOPIC PREGNANCY SURGERY    . NOVASURE ABLATION    . TONSILLECTOMY  10/02/2011   Procedure: TONSILLECTOMY;  Surgeon: Drema Halonhristopher E Newman, MD;  [facility];  Service: ENT;  Laterality: Bilateral;  . TONSILLECTOMY      OB History    No data available       Home Medications    Prior to Admission medications   Medication Sig Start Date End Date Taking? Authorizing Provider  acetaminophen (TYLENOL) 500 MG tablet Take 2 tablets (1,000 mg total) by mouth every 6 (six) hours as needed. 09/19/16   Arby BarrettePfeiffer, Sheela Mcculley, MD  cephALEXin (KEFLEX) 500 MG capsule Take 1 capsule (500 mg total) by mouth 3 (three) times daily. 03/03/15   Mackuen, Courteney Lyn, MD  ibuprofen (ADVIL,MOTRIN) 800 MG tablet Take 1 tablet (800 mg total) by mouth 3 (three) times daily. 03/03/15   Mackuen, Courteney Lyn, MD  ondansetron (ZOFRAN ODT) 4 MG disintegrating tablet Take 1 tablet (4 mg total) by mouth every 4 (four) hours as needed for nausea or vomiting. 09/19/16   Arby BarrettePfeiffer, Sheketa Ende, MD  ondansetron (ZOFRAN) 4 MG tablet Take 1 tablet (4 mg total) by mouth every 8 (eight) hours as needed for nausea or vomiting. 03/03/15   Mackuen, Courteney Lyn, MD  oxyCODONE-acetaminophen (PERCOCET) 5-325 MG per tablet Take 1 tablet by mouth every 6 (six) hours as needed for moderate  pain. 12/30/13   Purvis Sheffield, MD  pantoprazole (PROTONIX) 20 MG tablet Take 1 tablet (20 mg total) by mouth daily. 09/19/16   Arby Barrette, MD    Family History History reviewed. No pertinent family history.  Social History Social History  Substance Use Topics  . Smoking status: Current Every Day Smoker    Packs/day: 1.00    Years: 20.00    Types: Cigarettes  . Smokeless tobacco: Never Used  . Alcohol use 1.0 oz/week    2 Standard drinks or equivalent per week     Comment: socially      Allergies   Dilaudid [hydromorphone hcl]   Review of Systems Review of Systems 10 Systems reviewed and are negative for  acute change except as noted in the HPI.  Physical Exam Updated Vital Signs BP (!) 106/59 (BP Location: Left Arm)   Pulse (!) 44   Temp 97.9 F (36.6 C) (Oral)   Resp 18   Ht 5' 6.5" (1.689 m)   Wt 79.4 kg (175 lb)   SpO2 98%   BMI 27.82 kg/m   Physical Exam  Constitutional: She is oriented to person, place, and time. She appears well-developed and well-nourished. No distress.  Patient appears ill and uncomfortable. She is however nontoxic. No respiratory distress. Mental status clear.  HENT:  Head: Normocephalic and atraumatic.  Nose: Nose normal.  Bilateral TMs normal. Posterior oropharynx widely patent. No erythema. Patient is edentulous on top teeth patient has some residual lower teeth none appear acutely or significantly decayed with any surrounding erythema or facial swelling.  Eyes: Pupils are equal, round, and reactive to light. Conjunctivae and EOM are normal.  Neck: Neck supple.  No meningismus, no lymphadenopathy.  Cardiovascular: Normal rate, regular rhythm, normal heart sounds and intact distal pulses.   No murmur heard. Pulmonary/Chest: Effort normal and breath sounds normal. No respiratory distress.  Abdominal: Soft. There is tenderness.  Patient has right lower lateral quadrant tenderness to palpation. No guarding. Patient does however seem significant only tender. No CVA tenderness.  Musculoskeletal: Normal range of motion. She exhibits no edema or tenderness.  Lower extremities are in good condition. No peripheral edema. No Tenderness. No joint swelling or redness.  Neurological: She is alert and oriented to person, place, and time. No cranial nerve deficit. She exhibits normal muscle tone. Coordination normal.  Skin: Skin is warm and dry. No rash noted.  Psychiatric: She has a normal mood and affect.  Nursing note and vitals reviewed.    ED Treatments / Results  Labs (all labs ordered are listed, but only abnormal results are displayed) Labs Reviewed    URINALYSIS, ROUTINE W REFLEX MICROSCOPIC - Abnormal; Notable for the following:       Result Value   Color, Urine AMBER (*)    All other components within normal limits  RAPID URINE DRUG SCREEN, HOSP PERFORMED - Abnormal; Notable for the following:    Tetrahydrocannabinol POSITIVE (*)    All other components within normal limits  LIPASE, BLOOD  COMPREHENSIVE METABOLIC PANEL  CBC  PREGNANCY, URINE    EKG  EKG Interpretation None       Radiology No results found.  Procedures Procedures (including critical care time)  Medications Ordered in ED Medications  ondansetron (ZOFRAN) injection 4 mg (4 mg Intravenous Given 09/19/16 1840)  sodium chloride 0.9 % bolus 1,000 mL (0 mLs Intravenous Stopped 09/19/16 2017)  sodium chloride 0.9 % bolus 1,000 mL (0 mLs Intravenous Stopped 09/19/16 2017)  ketorolac (TORADOL)  30 MG/ML injection 30 mg (30 mg Intravenous Given 09/19/16 1929)  pantoprazole (PROTONIX) injection 40 mg (40 mg Intravenous Given 09/19/16 1925)     Initial Impression / Assessment and Plan / ED Course  I have reviewed the triage vital signs and the nursing notes.  Pertinent labs & imaging results that were available during my care of the patient were reviewed by me and considered in my medical decision making (see chart for details).     Recheck: Patient feels improved after treatment. She reports she still feels generally achy but no longer has significant abdominal pain. Repeat abdominal examinations for mild discomfort with no focal guarding or severe localizing tenderness.  Final Clinical Impressions(s) / ED Diagnoses   Final diagnoses:  Nausea vomiting and diarrhea  Myalgia  Acute nonintractable headache, unspecified headache type  Right lower quadrant abdominal pain   At this time, I most suspect viral or possibly food related gastrointestinal illness. Symptoms started very acutely with multiple episodes of vomiting and a few episodes of diarrhea. Patient  gone on to have generalized myalgia and headache and severe fatigue. Labs however are normal without leukocytosis or evidence of dehydration on chemistry panel. Patient's abdominal examination is not highly suggestive of appendicitis or other surgical etiology. Careful precautions are reviewed for return if the patient is not improving over the next 24 hours or other symptoms are developing. New Prescriptions New Prescriptions   ACETAMINOPHEN (TYLENOL) 500 MG TABLET    Take 2 tablets (1,000 mg total) by mouth every 6 (six) hours as needed.   ONDANSETRON (ZOFRAN ODT) 4 MG DISINTEGRATING TABLET    Take 1 tablet (4 mg total) by mouth every 4 (four) hours as needed for nausea or vomiting.   PANTOPRAZOLE (PROTONIX) 20 MG TABLET    Take 1 tablet (20 mg total) by mouth daily.     Arby BarrettePfeiffer, Oshay Stranahan, MD 09/19/16 2111

## 2016-09-19 NOTE — Discharge Instructions (Signed)
Rest and take in plenty of fluids at home. Take acetaminophen and Zofran for symptoms of nausea and body aches. Take protonix daily for the next 2 weeks. Return to the emergency department if you are getting worse or symptoms are not improving over the next 24 hours. Schedule recheck with a family doctor as soon as possible.

## 2016-09-19 NOTE — ED Notes (Signed)
ED Provider at bedside. 

## 2016-09-19 NOTE — ED Triage Notes (Signed)
Patient reports that she has had N/V/D since Sunday when she woke up. The patient states that she is having pain in all her joints and stomach. She also reports that she is having chills, and diaphoresis. The patient also reports that she is having a Headache

## 2016-11-10 ENCOUNTER — Emergency Department (HOSPITAL_BASED_OUTPATIENT_CLINIC_OR_DEPARTMENT_OTHER): Payer: Self-pay

## 2016-11-10 ENCOUNTER — Observation Stay (HOSPITAL_BASED_OUTPATIENT_CLINIC_OR_DEPARTMENT_OTHER)
Admission: EM | Admit: 2016-11-10 | Discharge: 2016-11-11 | Disposition: A | Discharge status: Home / Self Care | Payer: Self-pay | Attending: Internal Medicine | Admitting: Internal Medicine

## 2016-11-10 ENCOUNTER — Encounter (HOSPITAL_BASED_OUTPATIENT_CLINIC_OR_DEPARTMENT_OTHER): Payer: Self-pay | Admitting: *Deleted

## 2016-11-10 DIAGNOSIS — Z791 Long term (current) use of non-steroidal anti-inflammatories (NSAID): Secondary | ICD-10-CM | POA: Insufficient documentation

## 2016-11-10 DIAGNOSIS — Z720 Tobacco use: Secondary | ICD-10-CM | POA: Diagnosis present

## 2016-11-10 DIAGNOSIS — R0789 Other chest pain: Principal | ICD-10-CM | POA: Insufficient documentation

## 2016-11-10 DIAGNOSIS — F17210 Nicotine dependence, cigarettes, uncomplicated: Secondary | ICD-10-CM | POA: Insufficient documentation

## 2016-11-10 DIAGNOSIS — E039 Hypothyroidism, unspecified: Secondary | ICD-10-CM | POA: Insufficient documentation

## 2016-11-10 DIAGNOSIS — Z8249 Family history of ischemic heart disease and other diseases of the circulatory system: Secondary | ICD-10-CM | POA: Insufficient documentation

## 2016-11-10 DIAGNOSIS — G8929 Other chronic pain: Secondary | ICD-10-CM | POA: Insufficient documentation

## 2016-11-10 DIAGNOSIS — IMO0001 Reserved for inherently not codable concepts without codable children: Secondary | ICD-10-CM

## 2016-11-10 DIAGNOSIS — R911 Solitary pulmonary nodule: Secondary | ICD-10-CM | POA: Insufficient documentation

## 2016-11-10 DIAGNOSIS — R079 Chest pain, unspecified: Secondary | ICD-10-CM

## 2016-11-10 DIAGNOSIS — R001 Bradycardia, unspecified: Secondary | ICD-10-CM | POA: Diagnosis present

## 2016-11-10 LAB — TROPONIN I: Troponin I: <0.03 ng/mL (ref <0.03)

## 2016-11-10 LAB — URINALYSIS, ROUTINE W REFLEX MICROSCOPIC
Bilirubin Urine: NEGATIVE
GLUCOSE, UA: NEGATIVE mg/dL
Hgb urine dipstick: NEGATIVE
KETONES UR: NEGATIVE mg/dL
Leukocytes, UA: NEGATIVE
Nitrite: NEGATIVE
PROTEIN: NEGATIVE mg/dL
Specific Gravity, Urine: <1.005 — ABNORMAL LOW (ref 1.005–1.030)
pH: 8 (ref 5.0–8.0)

## 2016-11-10 LAB — CBC
HCT: 36.2 % (ref 36.0–46.0)
HEMOGLOBIN: 12.2 g/dL (ref 12.0–15.0)
MCH: 30 pg (ref 26.0–34.0)
MCHC: 33.7 g/dL (ref 30.0–36.0)
MCV: 89.2 fL (ref 78.0–100.0)
PLATELETS: 258 10*3/uL (ref 150–400)
RBC: 4.06 MIL/uL (ref 3.87–5.11)
RDW: 12.6 % (ref 11.5–15.5)
WBC: 6.4 10*3/uL (ref 4.0–10.5)

## 2016-11-10 LAB — BASIC METABOLIC PANEL
ANION GAP: 5 (ref 5–15)
BUN: 10 mg/dL (ref 6–20)
CALCIUM: 8.8 mg/dL — AB (ref 8.9–10.3)
CO2: 23 mmol/L (ref 22–32)
Chloride: 108 mmol/L (ref 101–111)
Creatinine, Ser: 0.61 mg/dL (ref 0.44–1.00)
GFR calc non Af Amer: >60 mL/min (ref >60)
Glucose, Bld: 109 mg/dL — ABNORMAL HIGH (ref 65–99)
Potassium: 3.5 mmol/L (ref 3.5–5.1)
Sodium: 136 mmol/L (ref 135–145)

## 2016-11-10 MED ORDER — IBUPROFEN 800 MG PO TABS
ORAL_TABLET | ORAL | Status: AC
  Administered 2016-11-10: 800 mg
  Filled 2016-11-10: qty 1

## 2016-11-10 MED ORDER — NICOTINE 21 MG/24HR TD PT24: 21.0000 mg | MEDICATED_PATCH | Freq: Every day | TRANSDERMAL | Status: DC | PRN

## 2016-11-10 MED ORDER — HYDROCODONE-ACETAMINOPHEN 5-325 MG PO TABS: 1.0000 | ORAL_TABLET | ORAL | Status: DC | PRN

## 2016-11-10 MED ORDER — NITROGLYCERIN 0.4 MG SL SUBL
0.4000 mg | SUBLINGUAL_TABLET | Freq: Once | SUBLINGUAL | Status: AC
  Administered 2016-11-10: 0.4 mg via SUBLINGUAL
  Filled 2016-11-10: qty 1

## 2016-11-10 MED ORDER — NITROGLYCERIN 0.4 MG SL SUBL: 0.4000 mg | SUBLINGUAL_TABLET | SUBLINGUAL | Status: DC | PRN

## 2016-11-10 MED ORDER — ASPIRIN 300 MG RE SUPP: 300.0000 mg | RECTAL | Status: AC

## 2016-11-10 MED ORDER — ASPIRIN 81 MG PO CHEW
324.0000 mg | CHEWABLE_TABLET | ORAL | Status: AC
  Administered 2016-11-10: 324 mg via ORAL
  Filled 2016-11-10: qty 4

## 2016-11-10 MED ORDER — ALPRAZOLAM 0.25 MG PO TABS: 0.2500 mg | ORAL_TABLET | Freq: Two times a day (BID) | ORAL | Status: DC | PRN

## 2016-11-10 MED ORDER — ZOLPIDEM TARTRATE 5 MG PO TABS: 5.0000 mg | ORAL_TABLET | Freq: Every evening | ORAL | Status: DC | PRN

## 2016-11-10 MED ORDER — KETOROLAC TROMETHAMINE 30 MG/ML IJ SOLN: 30.0000 mg | Freq: Four times a day (QID) | INTRAMUSCULAR | Status: DC | PRN

## 2016-11-10 MED ORDER — ALBUTEROL SULFATE (2.5 MG/3ML) 0.083% IN NEBU: 2.5000 mg | INHALATION_SOLUTION | RESPIRATORY_TRACT | Status: DC | PRN

## 2016-11-10 MED ORDER — ACETAMINOPHEN 325 MG PO TABS: 650.0000 mg | ORAL_TABLET | ORAL | Status: DC | PRN

## 2016-11-10 MED ORDER — ENOXAPARIN SODIUM 40 MG/0.4ML ~~LOC~~ SOLN
40.0000 mg | SUBCUTANEOUS | Status: DC
  Administered 2016-11-10: 40 mg via SUBCUTANEOUS
  Filled 2016-11-10: qty 0.4

## 2016-11-10 MED ORDER — ASPIRIN EC 81 MG PO TBEC
81.0000 mg | DELAYED_RELEASE_TABLET | Freq: Every day | ORAL | Status: DC
  Administered 2016-11-11: 81 mg via ORAL
  Filled 2016-11-10: qty 1

## 2016-11-10 MED ORDER — GI COCKTAIL ~~LOC~~
30.0000 mL | Freq: Once | ORAL | Status: AC
  Administered 2016-11-10: 30 mL via ORAL
  Filled 2016-11-10: qty 30

## 2016-11-10 MED ORDER — ONDANSETRON HCL 4 MG/2ML IJ SOLN: 4.0000 mg | Freq: Four times a day (QID) | INTRAMUSCULAR | Status: DC | PRN

## 2016-11-10 NOTE — H&P (Signed)
History and Physical    Cheryl Simon UJW:[id number] DOB: Jun 30, 1970 DOA: 11/10/2016  PCP: Patient, No Pcp Per   Patient coming from: Home, by way of [facility]  Chief Complaint: Chest pain, cough, SOB  HPI: Cheryl Simon is a 46 y.o. female with medical history significant for chronic back pain, now presenting to the emergency department for evaluation of chest pain. Patient reports that she had been in her usual state of health until yesterday when she noted the acute onset of chest discomfort. She describes this as an "tightness" across the anterior chest. She rates it as severe in intensity, reports it to be waxing and waning, but with no appreciable alleviating or exacerbating factors. It first developed at rest. There has been some associated shortness of breath with this discomfort, and the patient also reports coughing without production. She reports some recent sinus congestion and postnasal drip. Denies wheezing or history of asthma or COPD. Denies nausea or diaphoresis. No recent fevers or chills.   ED Course: Upon arrival to the ED, patient is found to be afebrile, saturating well on room air, bradycardic in the high 40s to mid 50s, and with stable blood pressure. EKG features a sinus bradycardia with rate 47 and no significant change from prior. Chest x-ray is negative for acute cardiopulmonary disease but concerning for possible nodule. CT of the chest is also negative for acute pathology, but demonstrates a 5 mm subpleural nodule in the right middle lobe. She panel was unremarkable and CBC is entirely within normal limits. Troponin is undetectable 2 more than 3 hours apart. Urinalysis is notable only for a low specific gravity. Patient was treated with a GI cocktail in the emergency department and also given nitroglycerin. She remains hemodynamically stable and in no apparent respiratory distress and will be observed on the telemetry unit for ongoing evaluation and management of atypical chest  pain with family history of premature CAD.  Review of Systems:  All other systems reviewed and apart from HPI, are negative.  History reviewed. No pertinent past medical history.  Past Surgical History:  Procedure Laterality Date  . ABDOMINAL HYSTERECTOMY    . ECTOPIC PREGNANCY SURGERY    . NOVASURE ABLATION    . TONSILLECTOMY  10/02/2011   Procedure: TONSILLECTOMY;  Surgeon: Drema Halon, MD;  Location: [facility] OR;  Service: ENT;  Laterality: Bilateral;  . TONSILLECTOMY       reports that she has been smoking Cigarettes.  She has a 20.00 pack-year smoking history. She has never used smokeless tobacco. She reports that she drinks about 1.0 oz of alcohol per week . She reports that she uses drugs, including Marijuana, about 1 time per week.  Allergies  Allergen Reactions  . Dilaudid [Hydromorphone Hcl] Shortness Of Breath and Other (See Comments)    Blood pressure spikes    Family History  Problem Relation Age of Onset  . CAD Mother      Prior to Admission medications   Medication Sig Start Date End Date Taking? Authorizing Provider  ibuprofen (ADVIL,MOTRIN) 200 MG tablet Take 600 mg by mouth every 6 (six) hours as needed for headache (pain).   Yes [provider]  acetaminophen (TYLENOL) 500 MG tablet Take 2 tablets (1,000 mg total) by mouth every 6 (six) hours as needed. Patient not taking: Reported on 11/10/2016 09/19/16   Arby Barrette, MD  ibuprofen (ADVIL,MOTRIN) 800 MG tablet Take 1 tablet (800 mg total) by mouth 3 (three) times daily. Patient not taking: Reported on 11/10/2016  03/03/15   Mackuen, Courteney Lyn, MD  ondansetron (ZOFRAN ODT) 4 MG disintegrating tablet Take 1 tablet (4 mg total) by mouth every 4 (four) hours as needed for nausea or vomiting. Patient not taking: Reported on 11/10/2016 09/19/16   Arby Barrette, MD  ondansetron (ZOFRAN) 4 MG tablet Take 1 tablet (4 mg total) by mouth every 8 (eight) hours as needed for nausea or vomiting. Patient  not taking: Reported on 11/10/2016 03/03/15   Mackuen, Cindee Salt, MD  oxyCODONE-acetaminophen (PERCOCET) 5-325 MG per tablet Take 1 tablet by mouth every 6 (six) hours as needed for moderate pain. Patient not taking: Reported on 11/10/2016 12/30/13   Purvis Sheffield, MD  pantoprazole (PROTONIX) 20 MG tablet Take 1 tablet (20 mg total) by mouth daily. Patient not taking: Reported on 11/10/2016 09/19/16   Arby Barrette, MD    Physical Exam: Vitals:   11/10/16 1430 11/10/16 1516 11/10/16 1800 11/10/16 2157  BP: (!) 105/56 (!) 112/44 103/63 126/61  Pulse: (!) 44 (!) 45 (!) 47 (!) 41  Resp: Temp:    97.9 F (36.6 C)  TempSrc:    Oral  SpO2: 98% 98% 99% 100%  Weight:    77 kg (169 lb 12.8 oz)  Height:          Constitutional: NAD, calm, comfortable Eyes: PERTLA, lids and conjunctivae normal ENMT: Mucous membranes are moist. Posterior pharynx clear of any exudate or lesions.   Neck: normal, supple, no masses, no thyromegaly Respiratory: clear to auscultation bilaterally, no wheezing, no crackles. Normal respiratory effort.   Cardiovascular: Rate ~45 and regular. No extremity edema. No significant JVD. Abdomen: No distension, no tenderness, soft. Bowel sounds active.  Musculoskeletal: no clubbing / cyanosis. No joint deformity upper and lower extremities.   Skin: no significant rashes, lesions, ulcers. Warm, dry, well-perfused. Neurologic: CN 2-12 grossly intact. Sensation intact. Strength 5/5 in all 4 limbs.  Psychiatric: Alert and oriented x 3. Calm, cooperative.     Labs on Admission: I have personally reviewed following labs and imaging studies  CBC:  Recent Labs Lab 11/10/16 0852  WBC 6.4  HGB 12.2  HCT 36.2  MCV 89.2  PLT 258   Basic Metabolic Panel:  Recent Labs Lab 11/10/16 0852  NA 136  K 3.5  CL 108  CO2 23  GLUCOSE 109*  BUN 10  CREATININE 0.61  CALCIUM 8.8*   GFR: Estimated Creatinine Clearance: 95 mL/min (by C-G formula based on  SCr of 0.61 mg/dL). Liver Function Tests: No results for input(s): AST, ALT, ALKPHOS, BILITOT, PROT, ALBUMIN in the last 168 hours. No results for input(s): LIPASE, AMYLASE in the last 168 hours. No results for input(s): AMMONIA in the last 168 hours. Coagulation Profile: No results for input(s): INR, PROTIME in the last 168 hours. Cardiac Enzymes:  Recent Labs Lab 11/10/16 0852 11/10/16 1233  TROPONINI <0.03 <0.03   BNP (last 3 results) No results for input(s): PROBNP in the last 8760 hours. HbA1C: No results for input(s): HGBA1C in the last 72 hours. CBG: No results for input(s): GLUCAP in the last 168 hours. Lipid Profile: No results for input(s): CHOL, HDL, LDLCALC, TRIG, CHOLHDL, LDLDIRECT in the last 72 hours. Thyroid Function Tests: No results for input(s): TSH, T4TOTAL, FREET4, T3FREE, THYROIDAB in the last 72 hours. Anemia Panel: No results for input(s): VITAMINB12, FOLATE, FERRITIN, TIBC, IRON, RETICCTPCT in the last 72 hours. Urine analysis:    Component Value Date/Time   COLORURINE YELLOW 11/10/2016 1610  APPEARANCEUR CLEAR 11/10/2016 0852   LABSPEC <1.005 (L) 11/10/2016 0852   PHURINE 8.0 11/10/2016 0852   GLUCOSEU NEGATIVE 11/10/2016 0852   HGBUR NEGATIVE 11/10/2016 0852   BILIRUBINUR NEGATIVE 11/10/2016 0852   KETONESUR NEGATIVE 11/10/2016 0852   PROTEINUR NEGATIVE 11/10/2016 0852   UROBILINOGEN 0.2 12/30/2013 0927   NITRITE NEGATIVE 11/10/2016 0852   LEUKOCYTESUR NEGATIVE 11/10/2016 0852   Sepsis Labs: (procalcitonin:4,lacticidven:4) )No results found for this or any previous visit (from the past 240 hour(s)).   Radiological Exams on Admission: Dg Chest 2 View  Result Date: 11/10/2016 CLINICAL DATA:  Chest tightness and shortness of breath EXAM: CHEST  2 VIEW COMPARISON:  None. FINDINGS: No pneumothorax. The heart, hila, and mediastinum are normal. No infiltrate or mass. A nodular density projected over the left first costochondral junction  measures 7.2 mm. This is asymmetric to the right. No other acute abnormalities. IMPRESSION: 1. No acute abnormality. 2. A 7 mm nodular density projects over the left first costochondral junction, asymmetric to the right. Whether this represents focal degenerative change or a pulmonary nodule cannot be determined on this study. This could be further evaluated in a variety of ways including comparison to more remote outside imaging if available, repeat chest x-ray with kyphotic and lordotic positioning, or CT imaging. Electronically Signed   By: Gerome Sam III M.D   On: 11/10/2016 09:21   Ct Chest Wo Contrast  Result Date: 11/10/2016 CLINICAL DATA:  Possible lung nodule seen on chest x-ray. EXAM: CT CHEST WITHOUT CONTRAST TECHNIQUE: Multidetector CT imaging of the chest was performed following the standard protocol without IV contrast. COMPARISON:  Chest x-ray from same day. FINDINGS: Cardiovascular: No significant vascular findings. Normal heart size. No pericardial effusion. The thoracic aorta is normal in caliber. Minimal atherosclerotic vascular calcification of the aortic arch. Mediastinum/Nodes: No enlarged mediastinal or axillary lymph nodes. Thyroid gland, trachea, and esophagus demonstrate no significant findings. Lungs/Pleura: No suspicious pulmonary nodule. 5 mm subpleural nodule in right middle lobe (series 3, image 91). Punctate calcified granuloma in the left upper lobe. No pleural effusion or pneumothorax. Upper Abdomen: No acute abnormality. Musculoskeletal: No chest wall mass or suspicious bone lesions identified. Stable sclerotic focus in the T12 vertebral body, dating back to 2010. IMPRESSION: 1. No CT correlate for the 7 mm nodular density projecting over the left upper lobe seen on chest x-ray. 2. 5 mm subpleural nodule in the right middle lobe. No follow-up needed if patient is low-risk. Non-contrast chest CT can be considered in 12 months if patient is high-risk. This recommendation  follows the consensus statement: Guidelines for Management of Incidental Pulmonary Nodules Detected on CT Images: From the Fleischner Society 2017; Radiology 2017; 284:228-243. Electronically Signed   By: Obie Dredge M.D.   On: 11/10/2016 10:32    EKG: Independently reviewed. Sinus bradycardia (rate 47). No significant change from prior.   Assessment/Plan  1. Chest pain  - Pt presents with chest pain that began the day prior  - Description is quite atypical, but her FHx of MI in mother at age 71 raised concern  - ED workup includes EKG that appears unchanged from priors, undetectable troponin x2, and unremarkable chest CT  - She was treated with GI cocktail in ED with no improvement; question a respiratory etiology, will trial albuterol neb - Plan to administer ASA 324 mg, continue cardiac monitoring, repeat EKG, and continue serial troponin measurements    2. Bradycardia  - Sinus rhythm confirmed on EKG  - Appears to  be chronic; HR was 45 on last EKG from November 2015, HR 52 on EKG from 2013   - Doubt this is related to current chest pain given the chronicity - Continue cardiac monitoring    3. Tobacco abuse  - Counseled toward cessation  - Nicotine patch offered   4. Lung nodule  - A 5mm subpleural RML nodule noted incidentally on CT and discussed with patient - Follow-up with non-contrast chest CT recommended in 12 months    DVT prophylaxis: sq Lovenox Code Status: Full  Family Communication: Discussed with patient Disposition Plan: Observe on telemetry Consults called: None Admission status: Observation    Briscoe Deutscher, MD [facility] Pager [phone]  If 7PM-7AM, please contact night-coverage [email or web address] Password [email or web address]  11/10/2016, 10:32 PM

## 2016-11-10 NOTE — ED Provider Notes (Addendum)
[facility] Provider Note   CSN: [id number] Arrival date & time: 11/10/16  4098     History   Chief Complaint Chief Complaint  Patient presents with  . Chest Pain  . Nasal Congestion    HPI Cheryl Simon is a 46 y.o. female.  HPI  patient presents with chest pain. States it is like a band in her mid chest. She smokes. States she frequent coughs in the morning but has had more pain today. States it also began last night. States she has had allergies that tend to give her coughing. States she had pain last night it was worse after eating. States she thought was from eating fast before baseball game. Now she feels little short of breath and tightness in her mid chest. States her mother had her first heart attack at 57 years old and had to have a bypass at 54. Patient states it feels that there is tight and on the ribs and when she coughs it gets worse. Patient states that she has lost 70 pounds by eating less and having more exercise History reviewed. No pertinent past medical history.  Patient Active Problem List   Diagnosis Date Noted  . 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 Tonsillitis 10/01/2011  . Tobacco abuse 10/01/2011    Past Surgical History:  Procedure Laterality Date  . ABDOMINAL HYSTERECTOMY    . ECTOPIC PREGNANCY SURGERY    . NOVASURE ABLATION    . TONSILLECTOMY  10/02/2011   Procedure: TONSILLECTOMY;  Surgeon: Drema Halon, MD;  Location: [facility] OR;  Service: ENT;  Laterality: Bilateral;  . TONSILLECTOMY      OB History    No data available       Home Medications    Prior to Admission medications   Medication Sig Start Date End Date Taking? Authorizing Provider  acetaminophen (TYLENOL) 500 MG tablet Take 2 tablets (1,000 mg total) by mouth every 6 (six) hours as needed. 09/19/16   Arby Barrette, MD  cephALEXin (KEFLEX) 500 MG capsule Take 1 capsule (500 mg total) by mouth 3 (three) times daily. 03/03/15   Mackuen, Courteney  Lyn, MD  ibuprofen (ADVIL,MOTRIN) 800 MG tablet Take 1 tablet (800 mg total) by mouth 3 (three) times daily. 03/03/15   Mackuen, Courteney Lyn, MD  ondansetron (ZOFRAN ODT) 4 MG disintegrating tablet Take 1 tablet (4 mg total) by mouth every 4 (four) hours as needed for nausea or vomiting. 09/19/16   Arby Barrette, MD  ondansetron (ZOFRAN) 4 MG tablet Take 1 tablet (4 mg total) by mouth every 8 (eight) hours as needed for nausea or vomiting. 03/03/15   Mackuen, Courteney Lyn, MD  oxyCODONE-acetaminophen (PERCOCET) 5-325 MG per tablet Take 1 tablet by mouth every 6 (six) hours as needed for moderate pain. 12/30/13   Purvis Sheffield, MD  pantoprazole (PROTONIX) 20 MG tablet Take 1 tablet (20 mg total) by mouth daily. 09/19/16   Arby Barrette, MD    Family History No family history on file.  Social History Social History  Substance Use Topics  . Smoking status: Current Every Day Smoker    Packs/day: 1.00    Years: 20.00    Types: Cigarettes  . Smokeless tobacco: Never Used  . Alcohol use 1.0 oz/week    2 Standard drinks or equivalent per week     Comment: socially      Allergies   Dilaudid [hydromorphone hcl]   Review of Systems Review of Systems  Constitutional: Positive for fatigue. Negative  for appetite change and fever.  HENT: Positive for congestion.   Respiratory: Positive for cough and shortness of breath.   Cardiovascular: Positive for chest pain. Negative for leg swelling.     Physical Exam Updated Vital Signs BP (!) 115/56   Pulse (!) 44   Temp 98 F (36.7 C) (Oral)   Resp (!) 21   Ht  (1.676 m)   Wt 77.1 kg (170 lb)   SpO2 100%   BMI 27.44 kg/m   Physical Exam  Constitutional: She appears well-developed.  Patient appears uncomfortable  HENT:  Head: Atraumatic.  Neck: Neck supple.  Cardiovascular: Normal rate.   Pulmonary/Chest: Effort normal. She exhibits no tenderness.  Abdominal: Soft. There is no tenderness.  Musculoskeletal: She exhibits  edema.  Neurological: She is alert.  Skin: Skin is warm. Capillary refill takes less than 2 seconds.  Psychiatric: She has a normal mood and affect.     ED Treatments / Results  Labs (all labs ordered are listed, but only abnormal results are displayed) Labs Reviewed  BASIC METABOLIC PANEL - Abnormal; Notable for the following:       Result Value   Glucose, Bld 109 (*)    Calcium 8.8 (*)    All other components within normal limits  URINALYSIS, ROUTINE W REFLEX MICROSCOPIC - Abnormal; Notable for the following:    Specific Gravity, Urine <1.005 (*)    All other components within normal limits  CBC  TROPONIN I    EKG  EKG Interpretation  Date/Time:  Friday November 10 2016 08:34:54 EDT Ventricular Rate:  47 PR Interval:    QRS Duration: 93 QT Interval:  452 QTC Calculation: 400 R Axis:   68 Text Interpretation:  Sinus bradycardia No significant change since last tracing Confirmed by Benjiman Core [phone]) on 11/10/2016 8:40:37 AM       Radiology Dg Chest 2 View  Result Date: 11/10/2016 CLINICAL DATA:  Chest tightness and shortness of breath EXAM: CHEST  2 VIEW COMPARISON:  None. FINDINGS: No pneumothorax. The heart, hila, and mediastinum are normal. No infiltrate or mass. A nodular density projected over the left first costochondral junction measures 7.2 mm. This is asymmetric to the right. No other acute abnormalities. IMPRESSION: 1. No acute abnormality. 2. A 7 mm nodular density projects over the left first costochondral junction, asymmetric to the right. Whether this represents focal degenerative change or a pulmonary nodule cannot be determined on this study. This could be further evaluated in a variety of ways including comparison to more remote outside imaging if available, repeat chest x-ray with kyphotic and lordotic positioning, or CT imaging. Electronically Signed   By: Gerome Sam III M.D   On: 11/10/2016 09:21   Ct Chest Wo Contrast  Result Date:  11/10/2016 CLINICAL DATA:  Possible lung nodule seen on chest x-ray. EXAM: CT CHEST WITHOUT CONTRAST TECHNIQUE: Multidetector CT imaging of the chest was performed following the standard protocol without IV contrast. COMPARISON:  Chest x-ray from same day. FINDINGS: Cardiovascular: No significant vascular findings. Normal heart size. No pericardial effusion. The thoracic aorta is normal in caliber. Minimal atherosclerotic vascular calcification of the aortic arch. Mediastinum/Nodes: No enlarged mediastinal or axillary lymph nodes. Thyroid gland, trachea, and esophagus demonstrate no significant findings. Lungs/Pleura: No suspicious pulmonary nodule. 5 mm subpleural nodule in right middle lobe (series 3, image 91). Punctate calcified granuloma in the left upper lobe. No pleural effusion or pneumothorax. Upper Abdomen: No acute abnormality. Musculoskeletal: No chest wall mass or  suspicious bone lesions identified. Stable sclerotic focus in the T12 vertebral body, dating back to 2010. IMPRESSION: 1. No CT correlate for the 7 mm nodular density projecting over the left upper lobe seen on chest x-ray. 2. 5 mm subpleural nodule in the right middle lobe. No follow-up needed if patient is low-risk. Non-contrast chest CT can be considered in 12 months if patient is high-risk. This recommendation follows the consensus statement: Guidelines for Management of Incidental Pulmonary Nodules Detected on CT Images: From the Fleischner Society 2017; Radiology 2017; 284:228-243. Electronically Signed   By: Obie Dredge M.D.   On: 11/10/2016 10:32    Procedures Procedures (including critical care time)  Medications Ordered in ED Medications  nitroGLYCERIN (NITROSTAT) SL tablet 0.4 mg (not administered)  gi cocktail (Maalox,Lidocaine,Donnatal) (30 mLs Oral Given 11/10/16 1000)     Initial Impression / Assessment and Plan / ED Course  I have reviewed the triage vital signs and the nursing notes.  Pertinent labs &  imaging results that were available during my care of the patient were reviewed by me and considered in my medical decision making (see chart for details).     Patient with chest pain. Lower chest. Has some shortness of breath. Worse with some breathing and has had a little bit of cough. EKG bradycardic but otherwise reassuring. However the patient's mother had her first heart attack at 74 and had had a bypass but time she was 37. No relief with GI cocktail. Chest x-ray had potential nodule on it that was not present on CT scan. There was a separate nodule to follow-up on the CT scan. I think with her high risk smoking and family history it is worth admission to the hospital. Will discuss with hospitalist. No swelling of her legs. Not tachycardic. Not hypoxic.  Patient's been accepted for transfer. However pending bed at this time. Since it has been 3 hours will get a second troponin.  Final Clinical Impressions(s) / ED Diagnoses   Final diagnoses:  Chest pain, unspecified type    New Prescriptions New Prescriptions   No medications on file     Benjiman Core, MD 11/10/16 1106    Benjiman Core, MD 11/10/16 1229

## 2016-11-10 NOTE — ED Notes (Signed)
ED Provider at bedside. 

## 2016-11-10 NOTE — ED Notes (Signed)
Patient transported to CT 

## 2016-11-10 NOTE — ED Triage Notes (Signed)
Pt reports nasal congestion and chest pain with associated sob since last night. Reports worsening pain with deep breathing and coughing. Denies fever, n/v/d.

## 2016-11-10 NOTE — ED Notes (Signed)
Pt notified an admission bed has been assigned but not ready. Pt upset d/t long waits with no food just crackers, food offered and refused d/t pt has no teeth in. Pt asked to go outside and smoke. Pt educated on our no smoking policy. Pt in no distress.

## 2016-11-10 NOTE — Progress Notes (Signed)
46 year old patient whose mother had her first MI at 48 and CABG at 66 was seen at [facility] for bilateral lower chest pressure for the past 16 hours. Sensation has been present throughout, not worsened with exertion. There may be some worsening with deep breathing. Patient is now transferred to [facility] to rule out for MI although the likelihood is low per ED physician given normal EKG and negative troponin even after 16 hours of pain. GI cocktail has not been effective. They're just giving her nitroglycerin at present. She is hemodynamically stable. She does not have hypertension, diabetes or hyperlipidemia however she does not go to physicians in general. Patient is accepted for observation and telemetry for rule out MI.

## 2016-11-10 NOTE — ED Notes (Signed)
Pt's husband came to desk and informed RN that she would like something for pain.  Informed pt's primary RN

## 2016-11-11 DIAGNOSIS — R911 Solitary pulmonary nodule: Secondary | ICD-10-CM

## 2016-11-11 DIAGNOSIS — Z720 Tobacco use: Secondary | ICD-10-CM

## 2016-11-11 DIAGNOSIS — R0789 Other chest pain: Secondary | ICD-10-CM

## 2016-11-11 DIAGNOSIS — R001 Bradycardia, unspecified: Secondary | ICD-10-CM

## 2016-11-11 LAB — TROPONIN I: Troponin I: <0.03 ng/mL (ref <0.03)

## 2016-11-11 LAB — TSH: TSH: 7.193 u[IU]/mL — ABNORMAL HIGH (ref 0.350–4.500)

## 2016-11-11 LAB — HIV ANTIBODY (ROUTINE TESTING W REFLEX): HIV SCREEN 4TH GENERATION: NONREACTIVE

## 2016-11-11 MED ORDER — OMEPRAZOLE 20 MG PO CPDR: 20.0000 mg | DELAYED_RELEASE_CAPSULE | Freq: Every day | ORAL | 0 refills | Status: AC

## 2016-11-11 NOTE — Care Management Note (Addendum)
Case Management Note  Patient Details  Name: Cheryl Simon MRN: [id number] Date of Birth: 1970/08/17  Subjective/Objective:  From home with family, pta indep, presents with chest pain, she does not have insurance or PCP, she will make her follow up apt at the [facility] physicians where the rest of her family goes.  She states she can afford to get the Prilosec otc at [facility] for $14.00 , this is the only medication prescribed for her on the AVS.  She is for dc today, she has transportation home.  She has not other needs.    She has just been promoted at work and will be getting insurance soon. She has no needs.                Action/Plan:   Expected Discharge Date:  11/11/16               Expected Discharge Plan:  Home/Self Care  In-House Referral:     Discharge planning Services  CM Consult  Post Acute Care Choice:    Choice offered to:     DME Arranged:    DME Agency:     HH Arranged:    HH Agency:     Status of Service:  Completed, signed off  If discussed at [employer] of Stay Meetings, dates discussed:    Additional Comments:  Leone Haven, RN 11/11/2016, 10:22 AM

## 2016-11-11 NOTE — Progress Notes (Signed)
11/11/2016 12:04 PM  Discharge AVS meds taken today and those due this evening reviewed.  Follow-up appointments and when to call md reviewed.  D/C IV and TELE.  Questions and concerns addressed.   D/C home per orders. Kathryne Hitch

## 2016-11-11 NOTE — Discharge Summary (Signed)
Physician Discharge Summary  Cheryl Simon:[id number] DOB: Jan 09, 1971 DOA: 11/10/2016  PCP: Cheryl Simon, No Pcp Per  Admit date: 11/10/2016 Discharge date: 11/11/2016  Admitted From: home  Disposition:  home  Recommendations for outpatient Follow-up:  1. Follow up with Cardiology in [facility] in one week for follow-up of chest pain 2. Reestablish care with PCP for hypothyroid management  Home Health:  None  Equipment/Devices:  None   Discharge Condition:  Stable, improved CODE STATUS:  Full code  Diet recommendation:  Healthy heart   Brief/Interim Summary:  The Cheryl Simon is a 46 year old female with history of chronic back pain, obesity with a 70 pound weight loss in the last year, tobacco abuse, and strong family history of early heart disease.  Cheryl Simon developed chest pain 2 days ago that improved after belching. The date of admission, she awoke with a bandlike sensation around her chest that got worse with movement of her shoulders or by pressing on the lower part of her sternum. She felt Rashod Gougeon of breath but denied any nausea, diaphoresis, or lightheadedness. She is very active at work lifting heavy boxes and moving things around and has not noticed any chest tightness or shortness of breath at work recently. Her pains came and went after a few minutes and movement seem to make them better. 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 In the emergency department, she was bradycardic in the 40s, her EKG looks similar to prior, her chest x-ray was unremarkable except for a possible pulmonary nodule which was confirmed on a CT of the chest. Her troponins were negative. She felt better this morning and was asking to go home.  She feels her symptoms were related to a bronchitis.    Discharge Diagnoses:  Principal Problem:   Atypical chest pain Active Problems:   Tobacco abuse   Chronic pain   Sinus bradycardia   Lung nodule  < 6cm on CT  Chest pain with atypical and typical features, with HEART score of 4.  Troponins were negative.  Telemetry demonstrated sinus bradycardia in the high 30-40 range but increased to the 60s when she was walking around.  Her ECG has some rounded ST-segments in V1-V3 which are stable from prior but a little unusual in a young person.  Her mother had a heart attack at 82 years old which required bypass surgery.   -  Recommend close outpatient follow up with cardiology for further testing at their discretion  Pulmonary nodule, RML with ongoing smoking -  Repeat CT scan in 1 year  Elevated TSH, history of hypothyroidism -  Recommended that she see her PCP to have this test repeated in a few weeks and to start synthroid  Sinus bradycardia with appropriate heart rate elevations with exertion -  Avoid AV node blocking medications  Tobacco abuse, counseled cessation   Discharge Instructions  Discharge Instructions    Call MD for:  difficulty breathing, headache or visual disturbances    Complete by:  As directed    Call MD for:  extreme fatigue    Complete by:  As directed    Call MD for:  hives    Complete by:  As directed    Call MD for:  persistant dizziness or light-headedness    Complete by:  As directed    Call MD for:  persistant nausea and vomiting    Complete by:  As directed    Call MD  for:  severe uncontrolled pain    Complete by:  As directed    Call MD for:  temperature >100.4    Complete by:  As directed    Diet - low sodium heart healthy    Complete by:  As directed    Increase activity slowly    Complete by:  As directed        Medication List    STOP taking these medications   acetaminophen 500 MG tablet Commonly known as:  TYLENOL   ondansetron 4 MG disintegrating tablet Commonly known as:  ZOFRAN ODT   ondansetron 4 MG tablet Commonly known as:  ZOFRAN   oxyCODONE-acetaminophen 5-325 MG tablet Commonly known as:  PERCOCET   pantoprazole 20  MG tablet Commonly known as:  PROTONIX     TAKE these medications   ibuprofen 200 MG tablet Commonly known as:  ADVIL,MOTRIN Take 600 mg by mouth every 6 (six) hours as needed for headache (pain). What changed:  Another medication with the same name was removed. Continue taking this medication, and follow the directions you see here.   omeprazole 20 MG capsule Commonly known as:  PRILOSEC Take 1 capsule (20 mg total) by mouth daily.      Follow-up Information    [facility]. Schedule an appointment as soon as possible for a visit in 1 week(s).   Specialty:  Cardiology Why:  we will arrange for a follow-up appt and contact you. Contact information: [address] [employer] [place] [address] [phone]       [facility] [place]. Schedule an appointment as soon as possible for a visit in 1 month(s).   Why:  thyroid follow up Contact information: [address] [place] [address] [phone]          Allergies  Allergen Reactions  . Dilaudid [Hydromorphone Hcl] Shortness Of Breath and Other (See Comments)    Blood pressure spikes    Consultations: none   Procedures/Studies: Dg Chest 2 View  Result Date: 11/10/2016 CLINICAL DATA:  Chest tightness and shortness of breath EXAM: CHEST  2 VIEW COMPARISON:  None. FINDINGS: No pneumothorax. The heart, hila, and mediastinum are normal. No infiltrate or mass. A nodular density projected over the left first costochondral junction measures 7.2 mm. This is asymmetric to the right. No other acute abnormalities. IMPRESSION: 1. No acute abnormality. 2. A 7 mm nodular density projects over the left first costochondral junction, asymmetric to the right. Whether this represents focal degenerative change or a pulmonary nodule cannot be determined on this study. This could be further evaluated in a variety of ways including comparison to more remote outside imaging if  available, repeat chest x-ray with kyphotic and lordotic positioning, or CT imaging. Electronically Signed   By: Gerome Sam III M.D   On: 11/10/2016 09:21   Ct Chest Wo Contrast  Result Date: 11/10/2016 CLINICAL DATA:  Possible lung nodule seen on chest x-ray. EXAM: CT CHEST WITHOUT CONTRAST TECHNIQUE: Multidetector CT imaging of the chest was performed following the standard protocol without IV contrast. COMPARISON:  Chest x-ray from same day. FINDINGS: Cardiovascular: No significant vascular findings. Normal heart size. No pericardial effusion. The thoracic aorta is normal in caliber. Minimal atherosclerotic vascular calcification of the aortic arch. Mediastinum/Nodes: No enlarged mediastinal or axillary lymph nodes. Thyroid gland, trachea, and esophagus demonstrate no significant findings. Lungs/Pleura: No suspicious pulmonary nodule. 5 mm subpleural nodule in right middle lobe (series 3,  image 91). Punctate calcified granuloma in the left upper lobe. No pleural effusion or pneumothorax. Upper Abdomen: No acute abnormality. Musculoskeletal: No chest wall mass or suspicious bone lesions identified. Stable sclerotic focus in the T12 vertebral body, dating back to 2010. IMPRESSION: 1. No CT correlate for the 7 mm nodular density projecting over the left upper lobe seen on chest x-ray. 2. 5 mm subpleural nodule in the right middle lobe. No follow-up needed if Cheryl Simon is low-risk. Non-contrast chest CT can be considered in 12 months if Cheryl Simon is high-risk. This recommendation follows the consensus statement: Guidelines for Management of Incidental Pulmonary Nodules Detected on CT Images: From the Fleischner Society 2017; Radiology 2017; 284:228-243. Electronically Signed   By: Obie Dredge M.D.   On: 11/10/2016 10:32     Subjective: Denies further chest pains.  Chest pains were in the epigastric/lower sternal area. It feels like muscle tightness. She has not tightness in her right shoulder also. If  she straightens out her shoulders and puts her arms down by her side she can bring on the pain.  She denies any further chest pain since last night. She has been coughing some, but her phlegm is unchanged from her normal. She has chronic sinus problems with postnasal drip leading to her chronic cough plus she is a daily smoker.  Discharge Exam: Vitals:   11/11/16 0436 11/11/16 0442  BP: (!) 110/47   Pulse: (!) 46 (!) 43  Resp: 15 16  Temp: 97.9 F (36.6 C) 97.9 F (36.6 C)  SpO2: 97% 96%   Vitals:   11/10/16 1800 11/10/16 2157 11/11/16 0436 11/11/16 0442  BP: 103/63 126/61 (!) 110/47   Pulse: (!) 47 (!) 41 (!) 46 (!) 43  Resp: Temp:  97.9 F (36.6 C) 97.9 F (36.6 C) 97.9 F (36.6 C)  TempSrc:  Oral Oral Oral  SpO2: 99% 100% 97% 96%  Weight:  77 kg (169 lb 12.8 oz)  77.2 kg (170 lb 3.1 oz)  Height:        General: Pt is alert, awake, not in acute distress Cardiovascular: Bradycardic, S1/S2 +, no rubs, no gallops Respiratory: CTA bilaterally, no wheezing, no rhonchi Abdominal: Soft, NT, ND, bowel sounds + Extremities: no edema, no cyanosis    The results of significant diagnostics from this hospitalization (including imaging, microbiology, ancillary and laboratory) are listed below for reference.     Microbiology: No results found for this or any previous visit (from the past 240 hour(s)).   Labs: BNP (last 3 results) No results for input(s): BNP in the last 8760 hours. Basic Metabolic Panel:  Recent Labs Lab 11/10/16 0852  NA 136  K 3.5  CL 108  CO2 23  GLUCOSE 109*  BUN 10  CREATININE 0.61  CALCIUM 8.8*   Liver Function Tests: No results for input(s): AST, ALT, ALKPHOS, BILITOT, PROT, ALBUMIN in the last 168 hours. No results for input(s): LIPASE, AMYLASE in the last 168 hours. No results for input(s): AMMONIA in the last 168 hours. CBC:  Recent Labs Lab 11/10/16 0852  WBC 6.4  HGB 12.2  HCT 36.2  MCV 89.2  PLT 258   Cardiac  Enzymes:  Recent Labs Lab 11/10/16 0852 11/10/16 1233 11/10/16 2340 11/11/16 0418  TROPONINI <0.03 <0.03 <0.03 <0.03   BNP: Invalid input(s): POCBNP CBG: No results for input(s): GLUCAP in the last 168 hours. D-Dimer No results for input(s): DDIMER in the last 72 hours. Hgb A1c No results for  input(s): HGBA1C in the last 72 hours. Lipid Profile No results for input(s): CHOL, HDL, LDLCALC, TRIG, CHOLHDL, LDLDIRECT in the last 72 hours. Thyroid function studies  Recent Labs  11/10/16 2340  TSH 7.193*   Anemia work up No results for input(s): VITAMINB12, FOLATE, FERRITIN, TIBC, IRON, RETICCTPCT in the last 72 hours. Urinalysis    Component Value Date/Time   COLORURINE YELLOW 11/10/2016 0852   APPEARANCEUR CLEAR 11/10/2016 0852   LABSPEC <1.005 (L) 11/10/2016 0852   PHURINE 8.0 11/10/2016 0852   GLUCOSEU NEGATIVE 11/10/2016 0852   HGBUR NEGATIVE 11/10/2016 0852   BILIRUBINUR NEGATIVE 11/10/2016 0852   KETONESUR NEGATIVE 11/10/2016 0852   PROTEINUR NEGATIVE 11/10/2016 0852   UROBILINOGEN 0.2 12/30/2013 0927   NITRITE NEGATIVE 11/10/2016 0852   LEUKOCYTESUR NEGATIVE 11/10/2016 0852   Sepsis Labs Invalid input(s): PROCALCITONIN,  WBC,  LACTICIDVEN   Time coordinating discharge: Over 30 minutes  SIGNED:   Renae Fickle, MD  [facility] 11/11/2016, 2:35 PM Pager   If 7PM-7AM, please contact night-coverage [email or web address] Password [email or web address]

## 2016-11-16 ENCOUNTER — Encounter: Payer: Self-pay | Admitting: Cardiology

## 2016-11-16 ENCOUNTER — Ambulatory Visit (INDEPENDENT_AMBULATORY_CARE_PROVIDER_SITE_OTHER): Payer: Self-pay | Admitting: Cardiology

## 2016-11-16 VITALS — BP 110/68 | HR 55 | Ht 66.5 in | Wt 171.4 lb

## 2016-11-16 DIAGNOSIS — Z720 Tobacco use: Secondary | ICD-10-CM

## 2016-11-16 DIAGNOSIS — R0789 Other chest pain: Secondary | ICD-10-CM

## 2016-11-16 DIAGNOSIS — E039 Hypothyroidism, unspecified: Secondary | ICD-10-CM

## 2016-11-16 DIAGNOSIS — R001 Bradycardia, unspecified: Secondary | ICD-10-CM

## 2016-11-16 MED ORDER — NITROGLYCERIN 0.4 MG SL SUBL: 0.4000 mg | SUBLINGUAL_TABLET | SUBLINGUAL | 6 refills | Status: AC | PRN

## 2016-11-16 MED ORDER — ASPIRIN 81 MG PO TBEC: 81.0000 mg | DELAYED_RELEASE_TABLET | Freq: Every day | ORAL | 12 refills | Status: AC

## 2016-11-16 NOTE — Progress Notes (Signed)
Cardiology Office Note:    Date:  11/16/2016   ID:  Cheryl Simon, DOB 1971-01-23, MRN [id number]  PCP:  Patient, No Pcp Per  Cardiologist:  Garwin Brothers, MD   Referring MD: No ref. provider found    ASSESSMENT:    1. Atypical chest pain   2. Sinus bradycardia   3. Tobacco abuse   4. Hypothyroidism, unspecified type    PLAN:    In order of problems listed above:  1. Primary prevention stressed to the patient. Importance of compliance with diet and medications stressed and she verbalized understanding. Her chest pain is not typical for coronary etiology however in view of her risk factors including smoking, she will undergo exercise stress echo. She will take enteric coated baby aspirin on a regular basis until her next follow-up visit in a month. The minimal nitroglycerin prescription sent. His protocol and 911 protocol explained and she verbalized understanding. 2. I had an extensive discussion with her about quitting smoking. She's going to try to stop. 3. Also her TSH level is low and asked her to get in touch with her primary care physician about evaluating this. An she's gone to get established with one.   Medication Adjustments/Labs and Tests Ordered: Current medicines are reviewed at length with the patient today.  Concerns regarding medicines are outlined above.  Orders Placed This Encounter  Procedures  . ECHOCARDIOGRAM STRESS TEST   Meds ordered this encounter  Medications  . aspirin 81 MG EC tablet    Sig: Take 1 tablet (81 mg total) by mouth daily. Swallow whole.    Dispense:  30 tablet    Refill:  12  . nitroGLYCERIN (NITROSTAT) 0.4 MG SL tablet    Sig: Place 1 tablet (0.4 mg total) under the tongue every 5 (five) minutes as needed for chest pain.    Dispense:  11 tablet    Refill:  6     History of Present Illness:    Cheryl Simon is a 46 y.o. female who is being seen today for the evaluation of chest pain at the request of the emergency room.  Patient mentions to me that she was laying down and relaxing and felt a sensation of chest tightness. She felt like her chest was compressed. No orthopnea or PND. No radiation of the symptoms to the neck or to the arms. 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 She also is married and sexually active and sexual activity does not bring about any similar symptoms of chest tightness.at the time of my evaluation she is alert awake oriented and in no distress  History reviewed. No pertinent past medical history.  Past Surgical History:  Procedure Laterality Date  . ABDOMINAL HYSTERECTOMY    . ECTOPIC PREGNANCY SURGERY    . NOVASURE ABLATION    . TONSILLECTOMY  10/02/2011   Procedure: TONSILLECTOMY;  Surgeon: Drema Halon, MD;  [facility] OR;  Service: ENT;  Laterality: Bilateral;  . TONSILLECTOMY      Current Medications: Current Meds  Medication Sig  . ibuprofen (ADVIL,MOTRIN) 200 MG tablet Take 600 mg by mouth every 6 (six) hours as needed for headache (pain).  Marland Kitchen omeprazole (PRILOSEC) 20 MG capsule Take 1 capsule (20 mg total) by mouth daily.     Allergies:   Dilaudid [hydromorphone hcl]   Social History   Social  History  . Marital status: Married    Spouse name: N/A  . Number of children: N/A  . Years of education: N/A   Social History Main Topics  . Smoking status: Current Every Day Smoker    Packs/day: 0.50    Years: 20.00    Types: Cigarettes  . Smokeless tobacco: Never Used  . Alcohol use 1.0 oz/week    2 Standard drinks or equivalent per week     Comment: socially   . Drug use: Yes    Frequency: 1.0 time per week    Types: Marijuana     Comment: denies  . Sexual activity: Yes    Birth control/ protection: Surgical     Comment: hysterectomy   Other Topics Concern  . None   Social History Narrative  . None     Family History: The  patient's family history includes CAD in her maternal grandfather, maternal grandmother, and mother; Heart attack in her brother.  ROS:   Please see the history of present illness.    All other systems reviewed and are negative.  EKGs/Labs/Other Studies Reviewed:    The following studies were reviewed today: I reviewed emergency room records extensively.Questions were answered to her satisfaction. I reviewed lab work and EKG also.   Recent Labs: 09/19/2016: ALT 25 11/10/2016: BUN 10; Creatinine, Ser 0.61; Hemoglobin 12.2; Platelets 258; Potassium 3.5; Sodium 136; TSH 7.193  Recent Lipid Panel No results found for: CHOL, TRIG, HDL, CHOLHDL, VLDL, LDLCALC, LDLDIRECT  Physical Exam:    VS:  BP 110/68 (BP Location: Right Arm, Patient Position: Sitting)   Pulse (!) 55   Ht 5' 6.5" (1.689 m)   Wt 171 lb 6.4 oz (77.7 kg)   SpO2 98%   BMI 27.25 kg/m     Wt Readings from Last 3 Encounters:  11/16/16 171 lb 6.4 oz (77.7 kg)  11/11/16 170 lb 3.1 oz (77.2 kg)  09/19/16 175 lb (79.4 kg)     GEN: Patient is in no acute distress HEENT: Normal NECK: No JVD; No carotid bruits LYMPHATICS: No lymphadenopathy CARDIAC: S1 S2 regular, 2/6 systolic murmur at the apex. RESPIRATORY:  Clear to auscultation without rales, wheezing or rhonchi  ABDOMEN: Soft, non-tender, non-distended MUSCULOSKELETAL:  No edema; No deformity  SKIN: Warm and dry NEUROLOGIC:  Alert and oriented x 3 PSYCHIATRIC:  Normal affect    Signed, Garwin Brothers, MD  11/16/2016 3:05 PM    [facility]

## 2016-11-16 NOTE — Patient Instructions (Signed)
Medication Instructions:  Your physician has recommended you make the following change in your medication:  START Enteric coated baby aspirin & Nitroglycerin as needed for chest pain  Labwork: None  Testing/Procedures: Your physician has requested that you have a stress echocardiogram. For further information please visit [email or web address] Please follow instruction sheet as given.    Follow-Up: 1 month  Any Other Special Instructions Will Be Listed Below (If Applicable).     If you need a refill on your cardiac medications before your next appointment, please call your pharmacy.

## 2016-11-17 ENCOUNTER — Ambulatory Visit: Payer: Self-pay | Admitting: Cardiology

## 2016-12-04 ENCOUNTER — Encounter (HOSPITAL_BASED_OUTPATIENT_CLINIC_OR_DEPARTMENT_OTHER): Payer: Self-pay

## 2016-12-04 ENCOUNTER — Emergency Department (HOSPITAL_BASED_OUTPATIENT_CLINIC_OR_DEPARTMENT_OTHER): Payer: Self-pay

## 2016-12-04 ENCOUNTER — Emergency Department (HOSPITAL_BASED_OUTPATIENT_CLINIC_OR_DEPARTMENT_OTHER)
Admission: EM | Admit: 2016-12-04 | Discharge: 2016-12-04 | Disposition: A | Discharge status: Home / Self Care | Payer: Self-pay | Attending: Emergency Medicine | Admitting: Emergency Medicine

## 2016-12-04 DIAGNOSIS — Z7982 Long term (current) use of aspirin: Secondary | ICD-10-CM | POA: Insufficient documentation

## 2016-12-04 DIAGNOSIS — F17210 Nicotine dependence, cigarettes, uncomplicated: Secondary | ICD-10-CM | POA: Insufficient documentation

## 2016-12-04 DIAGNOSIS — R1010 Upper abdominal pain, unspecified: Secondary | ICD-10-CM | POA: Insufficient documentation

## 2016-12-04 DIAGNOSIS — E039 Hypothyroidism, unspecified: Secondary | ICD-10-CM | POA: Insufficient documentation

## 2016-12-04 DIAGNOSIS — Z79899 Other long term (current) drug therapy: Secondary | ICD-10-CM | POA: Insufficient documentation

## 2016-12-04 DIAGNOSIS — G8929 Other chronic pain: Secondary | ICD-10-CM | POA: Insufficient documentation

## 2016-12-04 HISTORY — DX: Other congenital malformations of gallbladder: Q44.1

## 2016-12-04 LAB — CBC
HEMATOCRIT: 35.2 % — AB (ref 36.0–46.0)
HEMOGLOBIN: 11.7 g/dL — AB (ref 12.0–15.0)
MCH: 30.2 pg (ref 26.0–34.0)
MCHC: 33.2 g/dL (ref 30.0–36.0)
MCV: 91 fL (ref 78.0–100.0)
Platelets: 260 10*3/uL (ref 150–400)
RBC: 3.87 MIL/uL (ref 3.87–5.11)
RDW: 12.6 % (ref 11.5–15.5)
WBC: 5.3 10*3/uL (ref 4.0–10.5)

## 2016-12-04 LAB — COMPREHENSIVE METABOLIC PANEL WITH GFR
ALT: 28 U/L (ref 14–54)
AST: 24 U/L (ref 15–41)
Albumin: 3.7 g/dL (ref 3.5–5.0)
Alkaline Phosphatase: 67 U/L (ref 38–126)
Anion gap: 7 (ref 5–15)
BUN: 7 mg/dL (ref 6–20)
CO2: 26 mmol/L (ref 22–32)
Calcium: 8.9 mg/dL (ref 8.9–10.3)
Chloride: 103 mmol/L (ref 101–111)
Creatinine, Ser: 0.64 mg/dL (ref 0.44–1.00)
GFR calc Af Amer: >60 mL/min
GFR calc non Af Amer: >60 mL/min
Glucose, Bld: 93 mg/dL (ref 65–99)
Potassium: 3.5 mmol/L (ref 3.5–5.1)
Sodium: 136 mmol/L (ref 135–145)
Total Bilirubin: 0.4 mg/dL (ref 0.3–1.2)
Total Protein: 7.4 g/dL (ref 6.5–8.1)

## 2016-12-04 LAB — URINALYSIS, ROUTINE W REFLEX MICROSCOPIC
Bilirubin Urine: NEGATIVE
Glucose, UA: NEGATIVE mg/dL
Hgb urine dipstick: NEGATIVE
Ketones, ur: NEGATIVE mg/dL
Leukocytes, UA: NEGATIVE
Nitrite: NEGATIVE
Protein, ur: NEGATIVE mg/dL
Specific Gravity, Urine: <1.005 — ABNORMAL LOW (ref 1.005–1.030)
pH: 6.5 (ref 5.0–8.0)

## 2016-12-04 LAB — LIPASE, BLOOD: Lipase: 22 U/L (ref 11–51)

## 2016-12-04 MED ORDER — KETOROLAC TROMETHAMINE 30 MG/ML IJ SOLN
30.0000 mg | Freq: Once | INTRAMUSCULAR | Status: AC
  Administered 2016-12-04: 30 mg via INTRAVENOUS
  Filled 2016-12-04: qty 1

## 2016-12-04 MED ORDER — HYDROCODONE-ACETAMINOPHEN 5-325 MG PO TABS
1.0000 | ORAL_TABLET | Freq: Once | ORAL | Status: AC
  Administered 2016-12-04: 1 via ORAL
  Filled 2016-12-04: qty 1

## 2016-12-04 MED ORDER — MORPHINE SULFATE (PF) 4 MG/ML IV SOLN
4.0000 mg | Freq: Once | INTRAVENOUS | Status: AC
  Administered 2016-12-04: 4 mg via INTRAVENOUS
  Filled 2016-12-04: qty 1

## 2016-12-04 MED ORDER — SODIUM CHLORIDE 0.9 % IV BOLUS (SEPSIS)
1000.0000 mL | Freq: Once | INTRAVENOUS | Status: AC
  Administered 2016-12-04: 1000 mL via INTRAVENOUS

## 2016-12-04 NOTE — ED Provider Notes (Signed)
[facility] EMERGENCY DEPARTMENT Provider Note   CSN: [id number] Arrival date & time: 12/04/16  1250     History   Chief Complaint Chief Complaint  Patient presents with  . Abdominal Pain    HPI Cheryl Simon is a 46 y.o. female.  HPI Patient is a 46 year old female presents theergency department with app 5-6 days of right-sided and right upper quadrant abdominal pain.  No fever but reports some nausea.  Denies vomiting.  No diarrhea.  Patient states she's had pain like this before and had her gallbladder evaluated but was told she did not have stones.she denies chest pain.  No shortness of breath.  No fevers or chills.  Symptoms are d to moderate in severity.  She's had no abdominal hysterectomy.she does report some urinary frequency without dysuria   Past Medical History:  Diagnosis Date  . Floating gallbladder     Patient Active Problem List   Diagnosis Date Noted  . Hypothyroid 11/16/2016  . Atypical chest pain 11/10/2016  . Chronic pain 11/10/2016  . Sinus bradycardia 11/10/2016  . Lung nodule < 6cm on CT   . 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 Tonsillitis 10/01/2011  . Tobacco abuse 10/01/2011    Past Surgical History:  Procedure Laterality Date  . ABDOMINAL HYSTERECTOMY    . ECTOPIC PREGNANCY SURGERY    . MOUTH SURGERY    . NOVASURE ABLATION    . TONSILLECTOMY  10/02/2011   Procedure: TONSILLECTOMY;  Surgeon: Drema Halon, MD;  Location: [facility] OR;  Service: ENT;  Laterality: Bilateral;  . TONSILLECTOMY      OB History    Gravida Para Term Preterm AB Living   0 1 2   SAB TAB Ectopic Multiple Live Births   0 0 1 0 2       Home Medications    Prior to Admission medications   Medication Sig Start Date End Date Taking? Authorizing Provider  aspirin 81 MG EC tablet Take 1 tablet (81 mg total) by mouth daily. Swallow whole. 11/16/16   Revankar, Aundra Dubin, MD  ibuprofen (ADVIL,MOTRIN) 200 MG tablet Take 600 mg by mouth  every 6 (six) hours as needed for headache (pain).    [provider]  nitroGLYCERIN (NITROSTAT) 0.4 MG SL tablet Place 1 tablet (0.4 mg total) under the tongue every 5 (five) minutes as needed for chest pain. 11/16/16 02/14/17  Revankar, Aundra Dubin, MD  omeprazole (PRILOSEC) 20 MG capsule Take 1 capsule (20 mg total) by mouth daily. 11/11/16 11/11/17  Renae Fickle, MD    Family History Family History  Problem Relation Age of Onset  . CAD Mother   . Heart attack Brother   . CAD Maternal Grandmother   . CAD Maternal Grandfather     Social History Social History  Substance Use Topics  . Smoking status: Current Every Day Smoker    Packs/day: 0.50    Years: 20.00    Types: Cigarettes  . Smokeless tobacco: Never Used  . Alcohol use Yes     Comment: occ     Allergies   Dilaudid [hydromorphone hcl]   Review of Systems Review of Systems  All other systems reviewed and are negative.    Physical Exam Updated Vital Signs BP 132/64 (BP Location: Left Arm)   Pulse (!) 47   Temp 98.1 F (36.7 C) (Oral)   Resp 16   Ht  (1.676 m)   Wt 76.9 kg (169 lb 8.5  oz)   SpO2 100%   BMI 27.36 kg/m   Physical Exam  Constitutional: She is oriented to person, place, and time. She appears well-developed and well-nourished. No distress.  HENT:  Head: Normocephalic and atraumatic.  Eyes: EOM are normal.  Neck: Normal range of motion.  Cardiovascular: Normal rate, regular rhythm and normal heart sounds.   Pulmonary/Chest: Effort normal and breath sounds normal.  Abdominal: Soft. She exhibits no distension.  Mild right upper quadrant abdominal tenderness without guarding or rebound.  No right lower quadrant tenderness  Musculoskeletal: Normal range of motion.  Neurological: She is alert and oriented to person, place, and time.  Skin: Skin is warm and dry.  Psychiatric: She has a normal mood and affect. Judgment normal.  Nursing note and vitals reviewed.    ED Treatments /  Results  Labs (all labs ordered are listed, but only abnormal results are displayed) Labs Reviewed  CBC - Abnormal; Notable for the following:       Result Value   Hemoglobin 11.7 (*)    HCT 35.2 (*)    All other components within normal limits  COMPREHENSIVE METABOLIC PANEL  LIPASE, BLOOD  URINALYSIS, ROUTINE W REFLEX MICROSCOPIC  PREGNANCY, URINE    EKG  EKG Interpretation None       Radiology Dg Chest 2 View  Result Date: 12/04/2016 CLINICAL DATA:  Cough and right upper quadrant pain. Current smoker. EXAM: CHEST  2 VIEW COMPARISON:  Chest x-ray and chest CT scan of November 10, 2016. FINDINGS: The lungs are adequately inflated and clear. The heart and pulmonary vascularity are normal. The trachea is midline. There is no pleural effusion. The bony thorax is unremarkable. The gas pattern in the upper abdomen is normal. IMPRESSION: There is no active cardiopulmonary disease. Electronically Signed   By: David  [place] M.D.   On: 12/04/2016 15:17   US Abdomen Limited Ruq  Result Date: 12/04/2016 CLINICAL DATA:  Upper abdominal pain, RIGHT upper quadrant pain intermittently for years, constant over past week EXAM: ULTRASOUND ABDOMEN LIMITED RIGHT UPPER QUADRANT COMPARISON:  CT abdomen and pelvis 03/03/2015 FINDINGS: Gallbladder: Normally distended without stones or wall thickening. No pericholecystic fluid or sonographic Murphy sign. Common bile duct: Diameter: 3 mm diameter , normal Liver: Normal appearance. No focal mass lesion or nodularity. Portal vein is patent on color Doppler imaging with normal direction of blood flow towards the liver. No RIGHT upper quadrant free fluid. IMPRESSION: Normal exam. Electronically Signed   By: Ulyses Southward M.D.   On: 12/04/2016 15:45    Procedures Procedures (including critical care time)  Medications Ordered in ED Medications  ketorolac (TORADOL) 30 MG/ML injection 30 mg (30 mg Intravenous Given 12/04/16 1504)  morphine 4 MG/ML injection 4  mg (4 mg Intravenous Given 12/04/16 1503)  sodium chloride 0.9 % bolus 1,000 mL (1,000 mLs Intravenous New Bag/Given 12/04/16 1501)     Initial Impression / Assessment and Plan / ED Course  I have reviewed the triage vital signs and the nursing notes.  Pertinent labs & imaging results that were available during my care of the patient were reviewed by me and considered in my medical decision making (see chart for details).     Ultrasound without evidence of gallstones.  Awaiting urine at this time.  No significant abnormalities in vital signs her laboratory studies thus far.  Outpatient GI follow-up if her urine shows no signs of infection.  Obviously if her urine looks infected we'll treat this as the possible source  of her symptoms.  I will a suspicion for appendicitis.  She has no right lower quadrant tenderness.  I do not think she needs CT imaging at this time.  Care to Dr Anitra Lauth to follow up on urine  Final Clinical Impressions(s) / ED Diagnoses   Final diagnoses:  Upper abdominal pain    New Prescriptions New Prescriptions   No medications on file     Azalia Bilis, MD 12/04/16 [phone]

## 2016-12-04 NOTE — ED Triage Notes (Signed)
C/o RUQ pain x 1 week-NAD-steady gait

## 2016-12-07 ENCOUNTER — Ambulatory Visit (HOSPITAL_BASED_OUTPATIENT_CLINIC_OR_DEPARTMENT_OTHER)
Admission: RE | Admit: 2016-12-07 | Discharge: 2016-12-07 | Disposition: A | Discharge status: Home / Self Care | Payer: Self-pay | Source: Ambulatory Visit | Attending: Cardiology | Admitting: Cardiology

## 2016-12-07 DIAGNOSIS — R0789 Other chest pain: Secondary | ICD-10-CM | POA: Insufficient documentation

## 2016-12-07 DIAGNOSIS — Z720 Tobacco use: Secondary | ICD-10-CM | POA: Insufficient documentation

## 2016-12-07 DIAGNOSIS — R001 Bradycardia, unspecified: Secondary | ICD-10-CM | POA: Insufficient documentation

## 2016-12-07 NOTE — Progress Notes (Signed)
  Echocardiogram Echocardiogram Stress Test has been performed.  Cheryl Simon T Cheryl Simon 12/07/2016, 10:49 AM

## 2016-12-19 ENCOUNTER — Ambulatory Visit: Payer: Self-pay | Admitting: Cardiology

## 2017-01-02 ENCOUNTER — Ambulatory Visit: Payer: Self-pay | Admitting: Cardiology

## 2018-08-27 IMAGING — DX DG CHEST 2V
2 series · 2 of 2 positions shown · non-contrast
Comparison: None.

CLINICAL DATA: Chest tightness and shortness of breath

EXAM:
CHEST  2 VIEW

[chest pa]
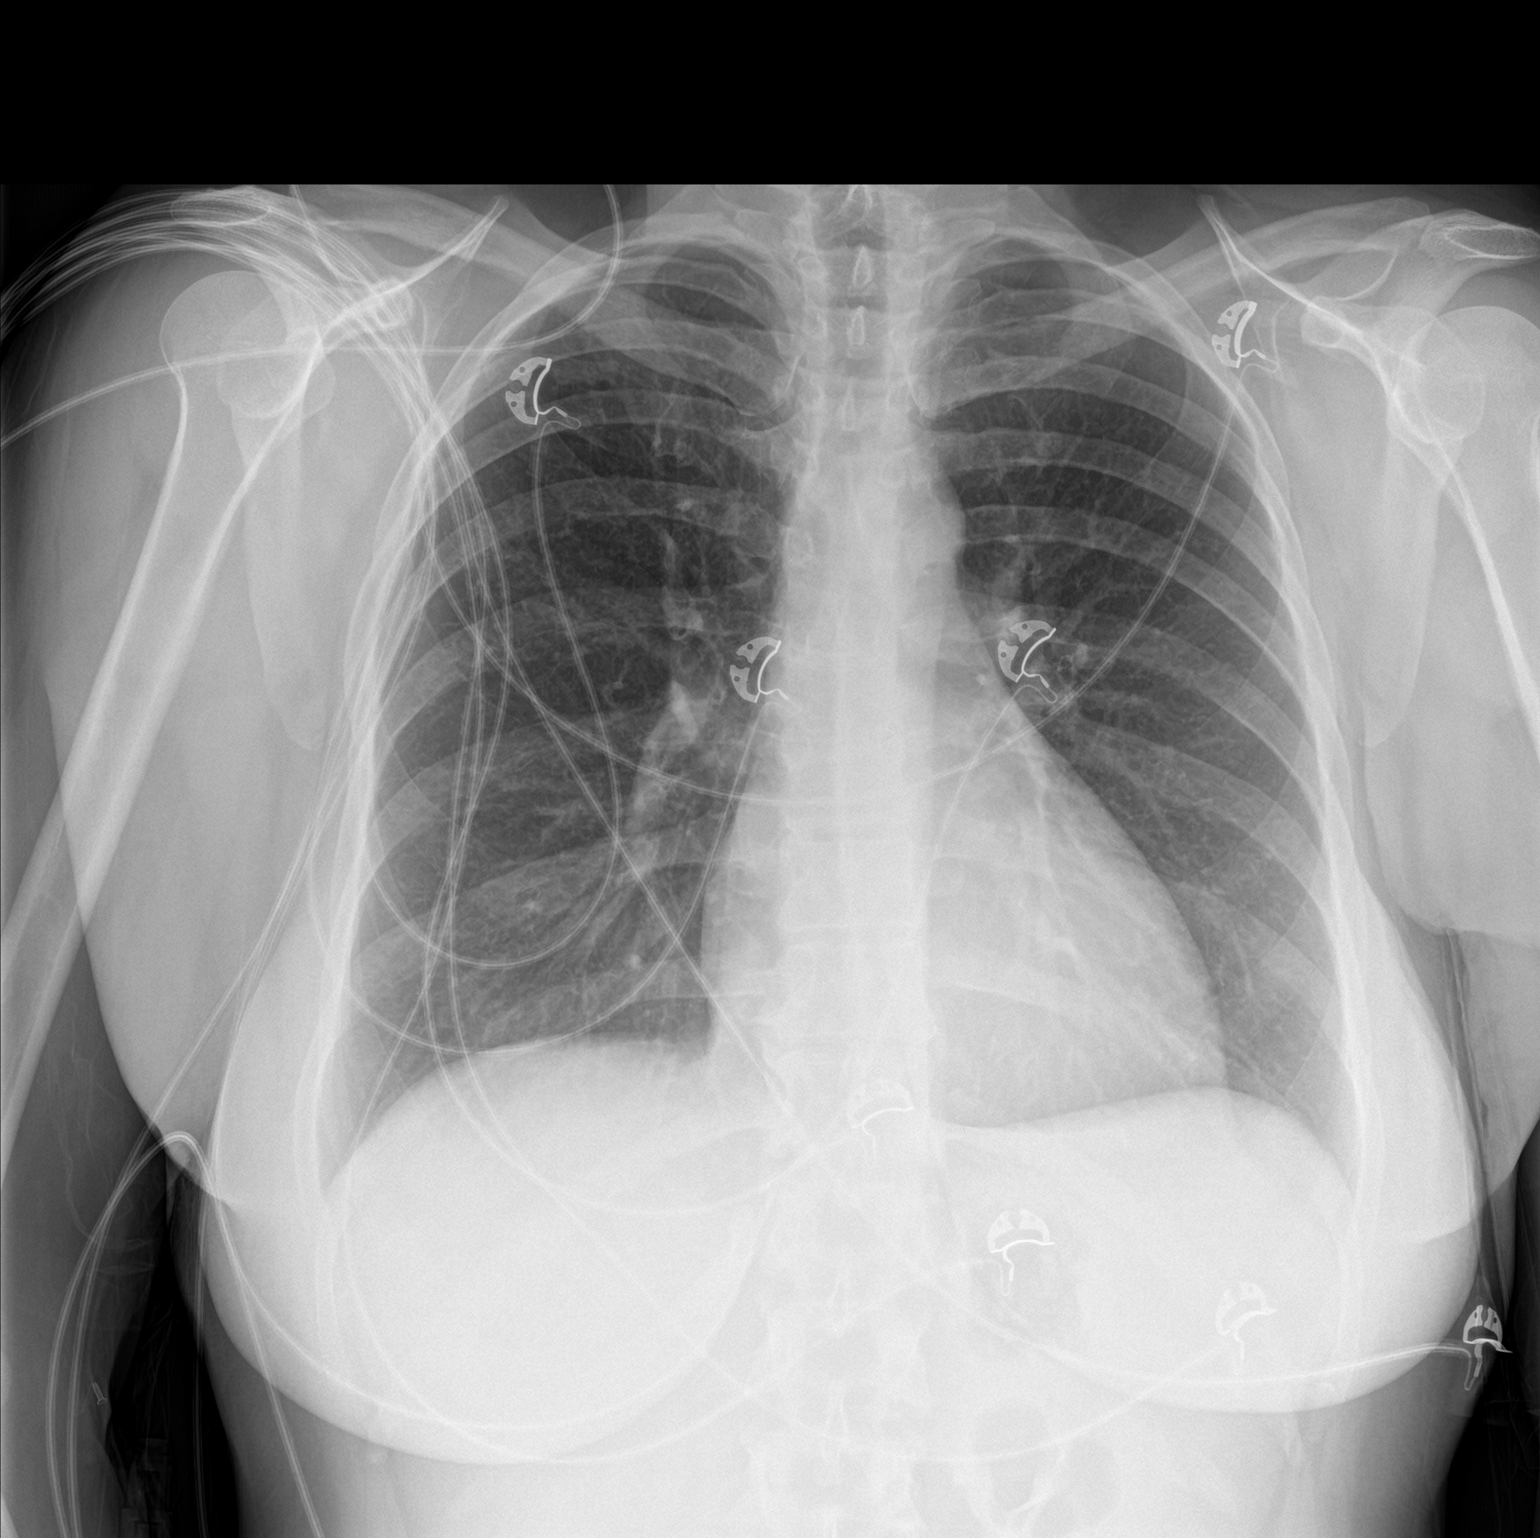

[chest lat]
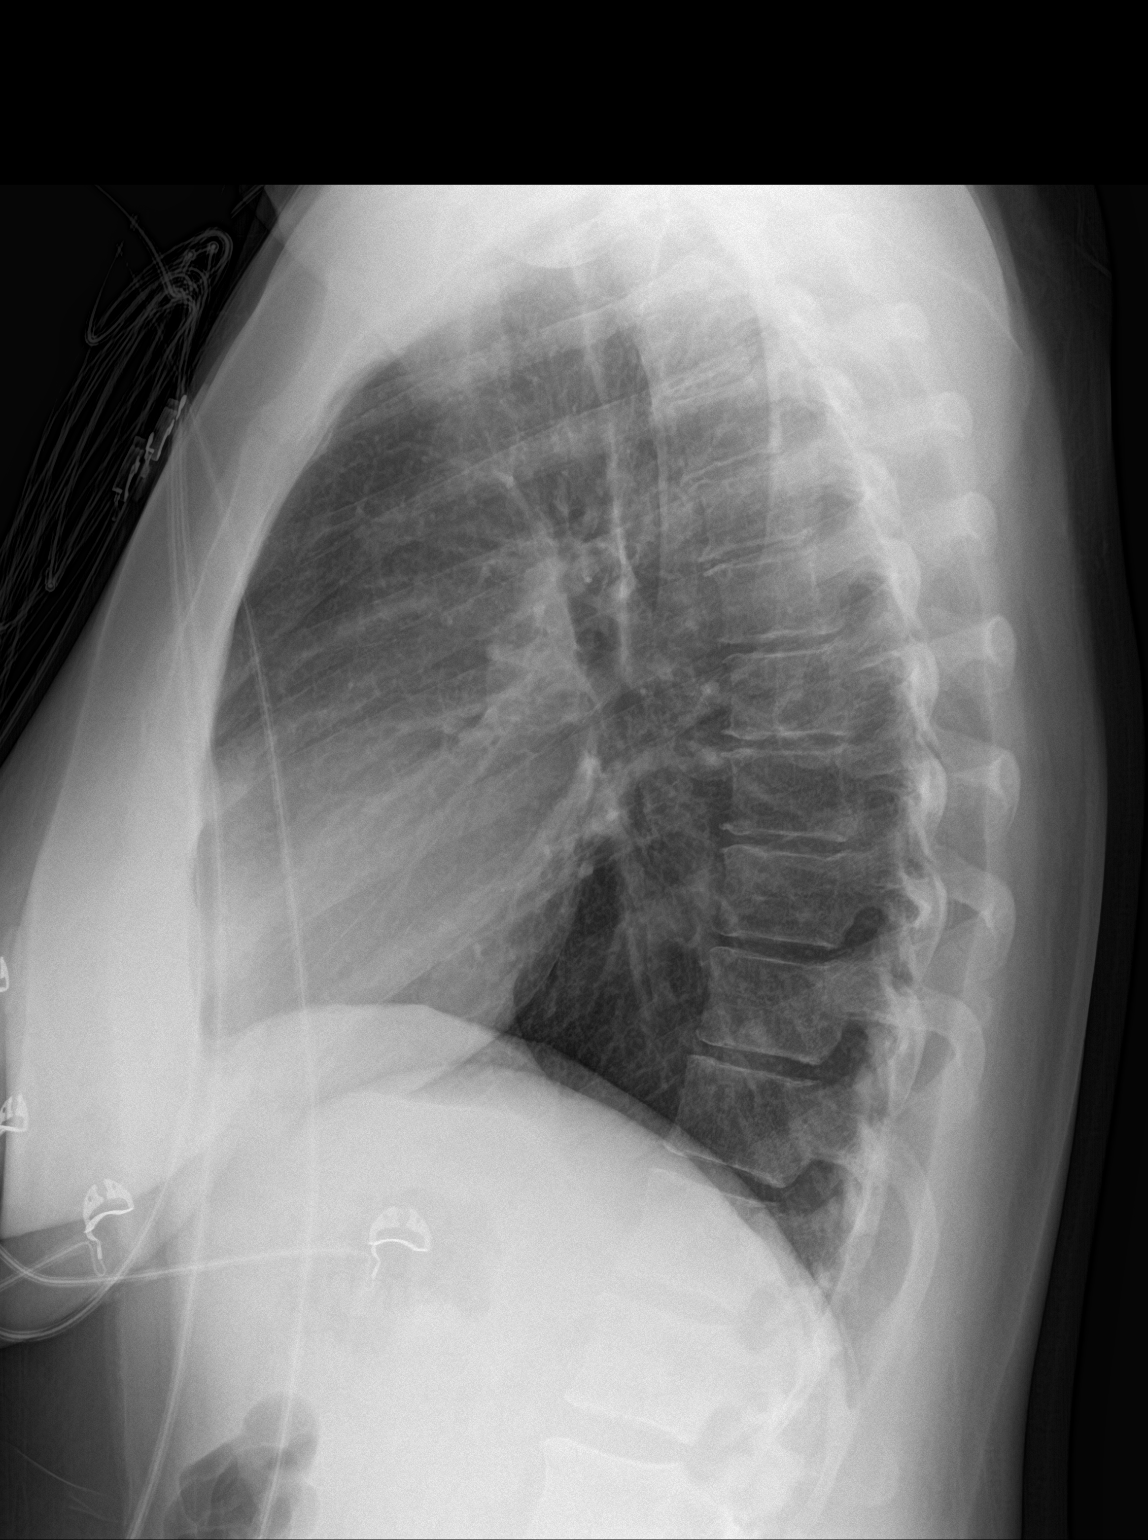

[2 of 2 positions shown; findings below may reference images not displayed]

FINDINGS: No pneumothorax. The heart, hila, and mediastinum are normal. No
infiltrate or mass. A nodular density projected over the left first
costochondral junction measures 7.2 mm. This is asymmetric to the
right. No other acute abnormalities.
IMPRESSION: 1. No acute abnormality.
2. A 7 mm nodular density projects over the left first costochondral
junction, asymmetric to the right. Whether this represents focal
degenerative change or a pulmonary nodule cannot be determined on
this study. This could be further evaluated in a variety of ways
including comparison to more remote outside imaging if available,
repeat chest x-ray with kyphotic and lordotic positioning, or CT
imaging.

## 2018-09-20 IMAGING — CR DG CHEST 2V
2 series · 2 of 2 positions shown · non-contrast
Comparison: Chest x-ray and chest CT scan November 10, 2016.

CLINICAL DATA: Cough and right upper quadrant pain. Current smoker.

EXAM:
CHEST  2 VIEW

[w chest pa]
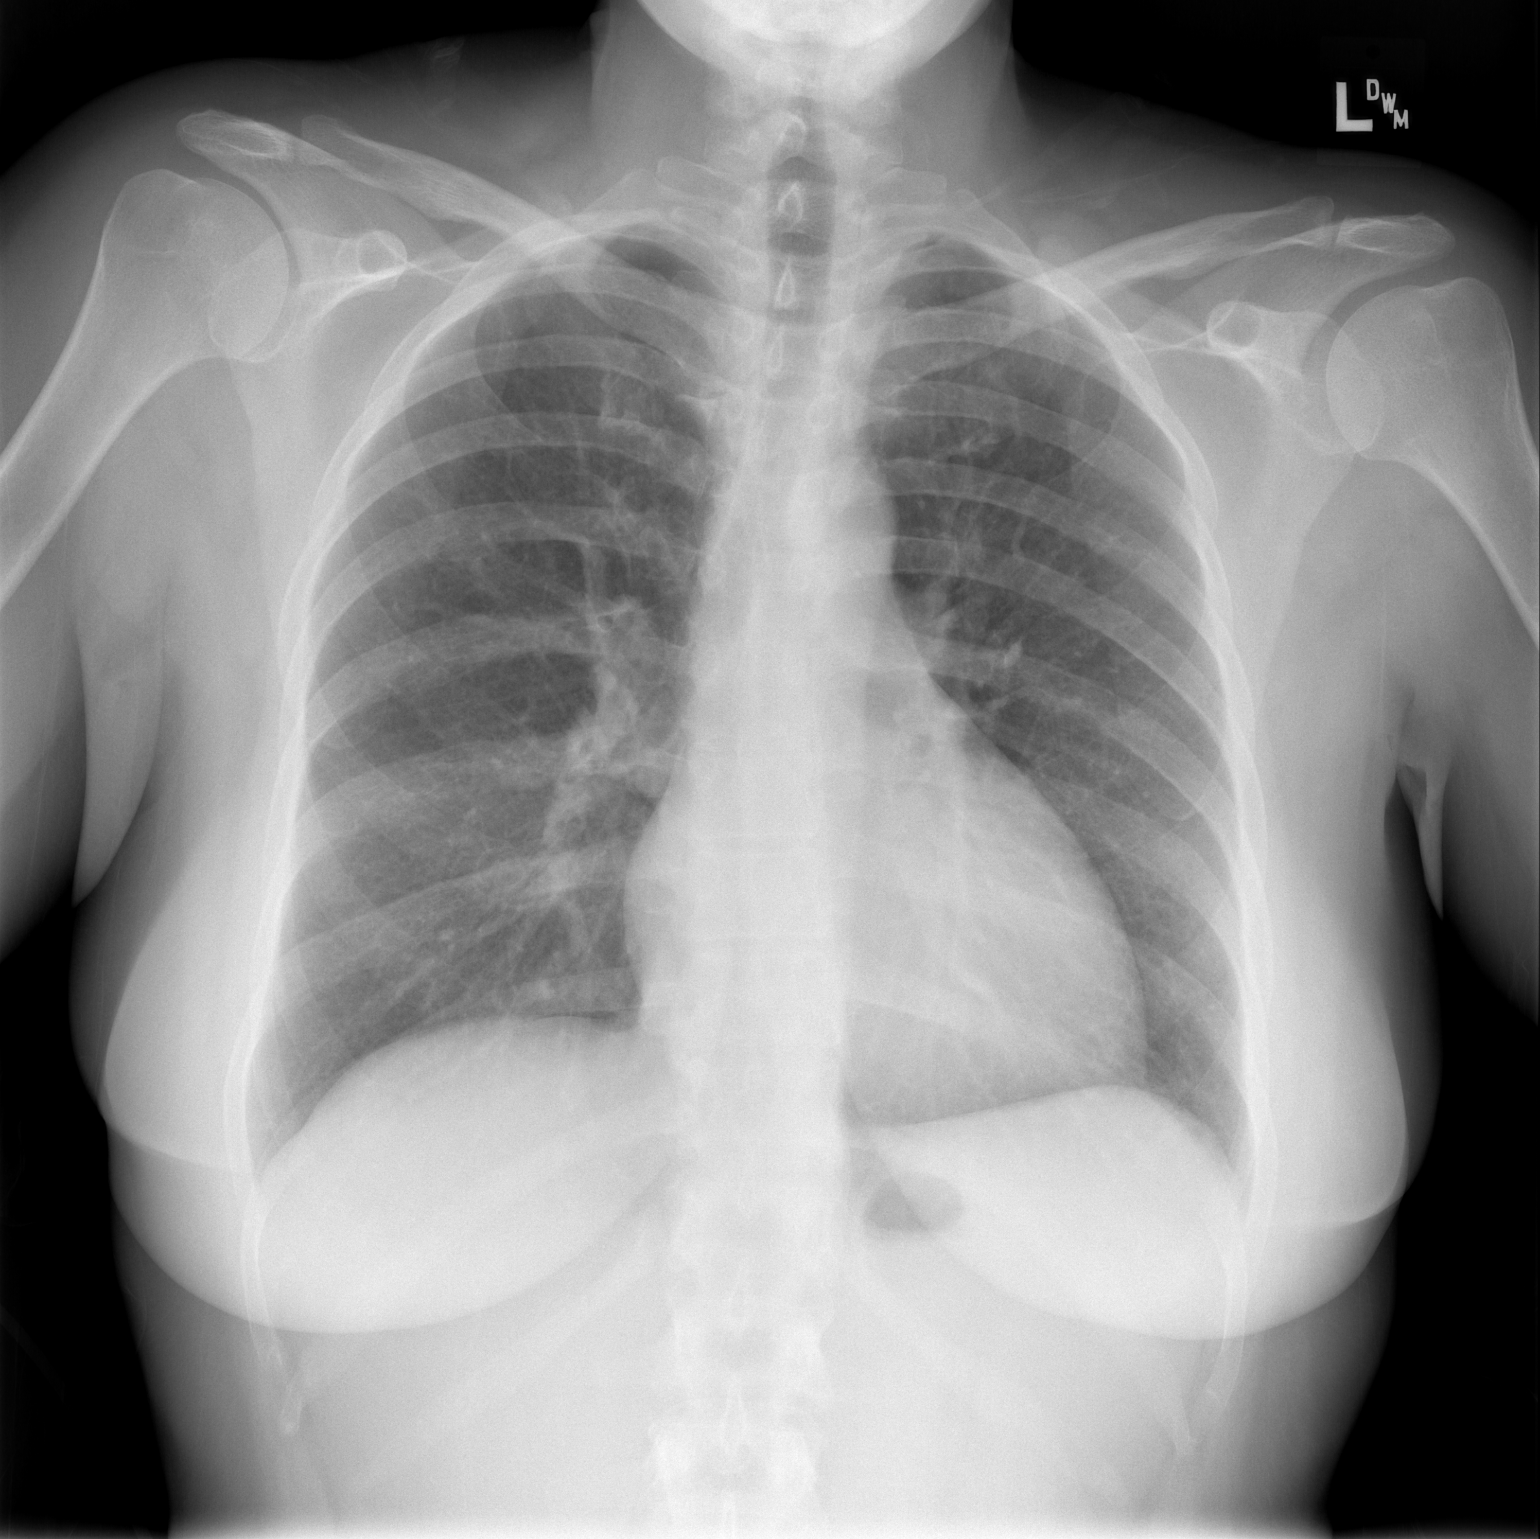

[w chest lat]
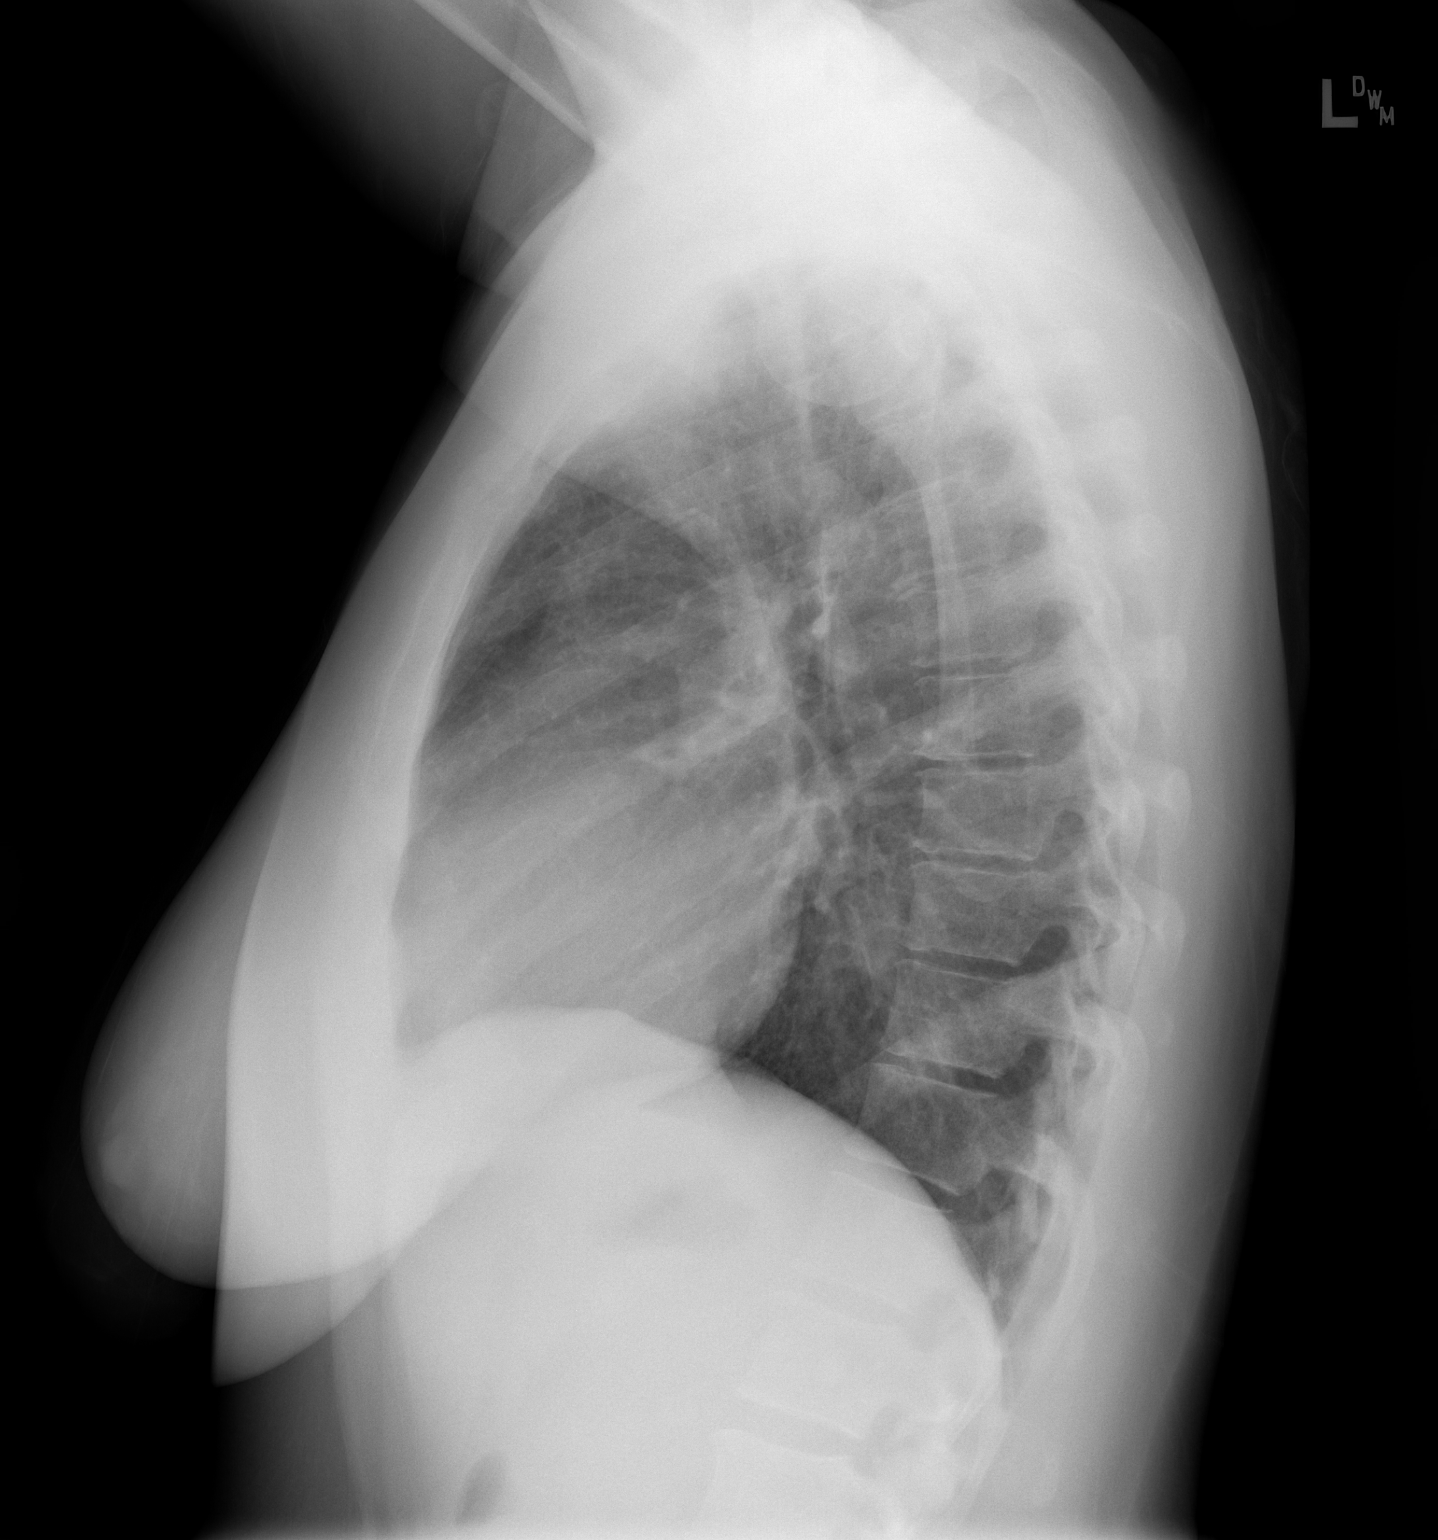

[2 of 2 positions shown; findings below may reference images not displayed]

FINDINGS: The lungs are adequately inflated and clear. The heart and pulmonary
vascularity are normal. The trachea is midline. There is no pleural
effusion. The bony thorax is unremarkable. The gas pattern in the
upper abdomen is normal.
IMPRESSION: There is no active cardiopulmonary disease.

## 2019-09-28 IMAGING — US US ABDOMEN LIMITED
1 series · 14 of 25 positions shown · non-contrast
Comparison: CT abdomen and pelvis 03/03/2015

CLINICAL DATA: Upper abdominal pain, RIGHT upper quadrant pain
intermittently for years, constant over past week

EXAM:
ULTRASOUND ABDOMEN LIMITED RIGHT UPPER QUADRANT

[Series 1: us abdomen limited · 0.22mm/px · 14 of 75 slices shown]
[im 1/75]
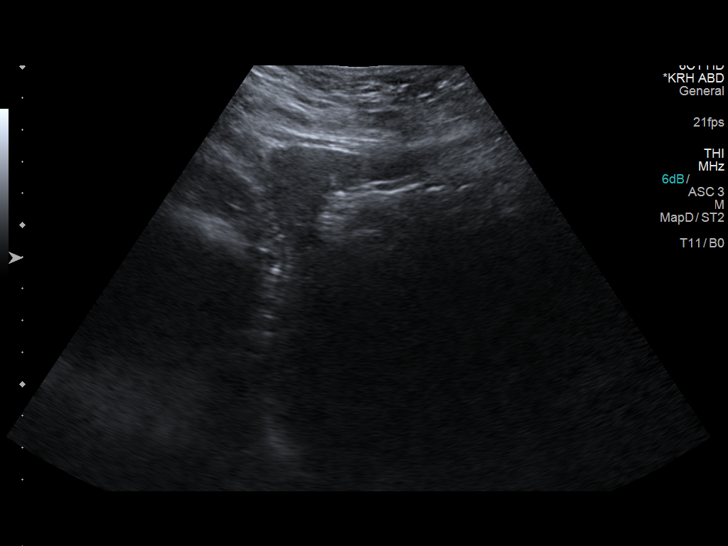
[im 7/75]
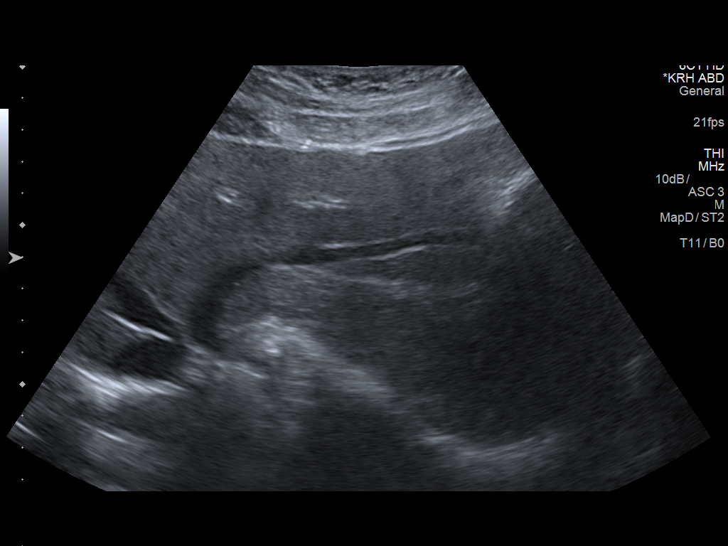
[im 13/75]
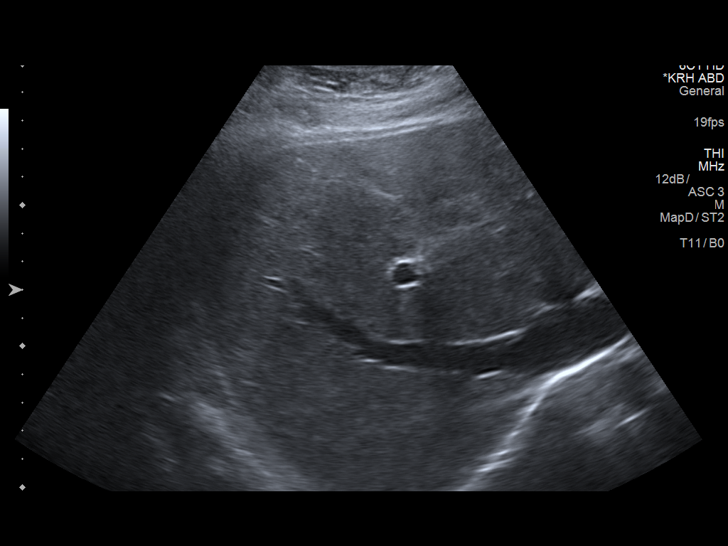
[im 19/75]
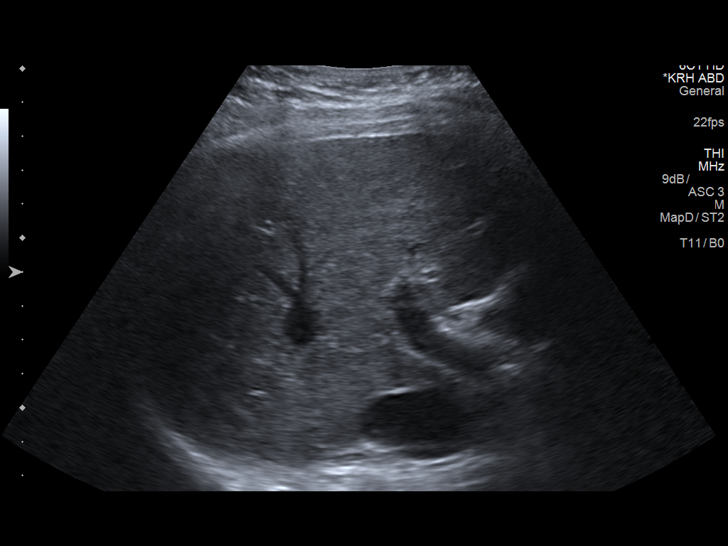
[im 25/75]
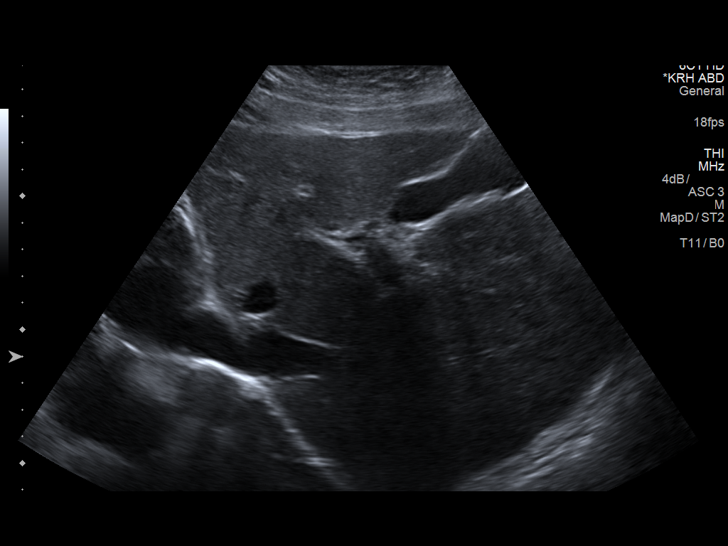
[im 28/75]
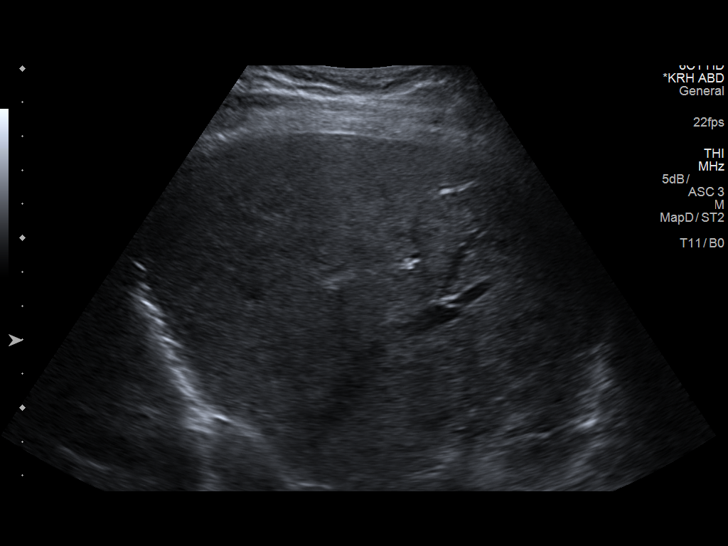
[im 34/75]
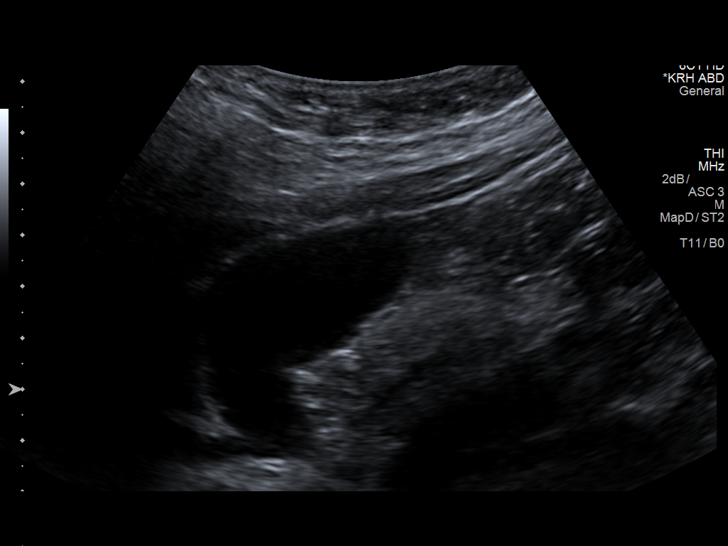
[im 41/75]
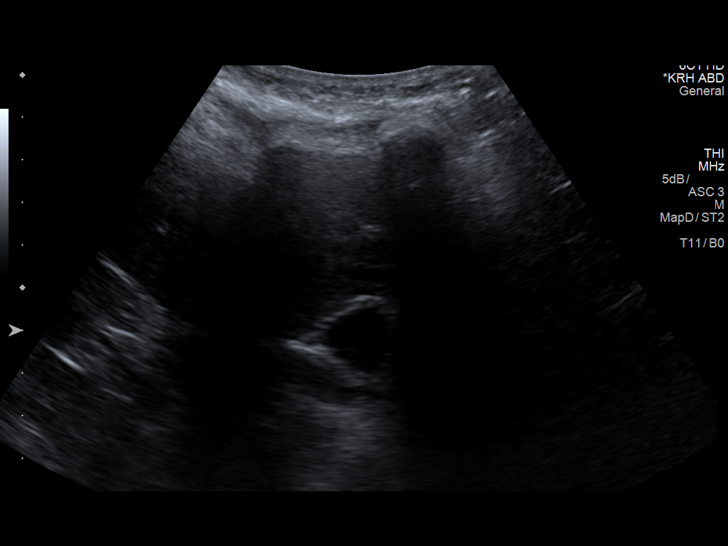
[im 47/75]
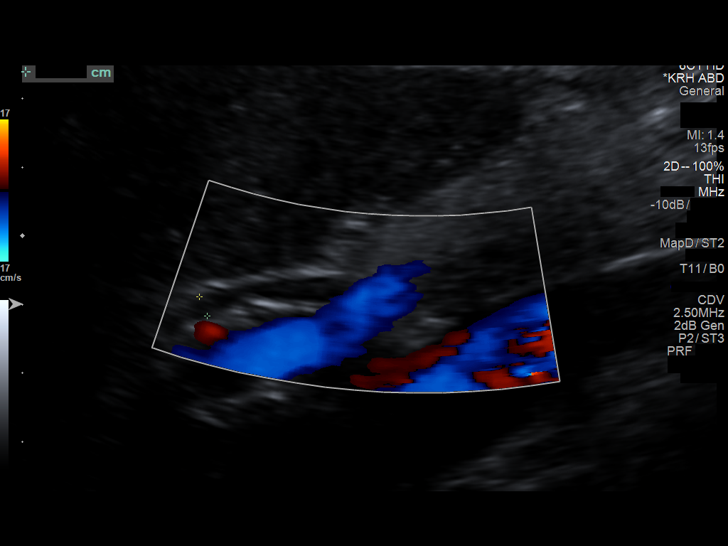
[im 50/75]
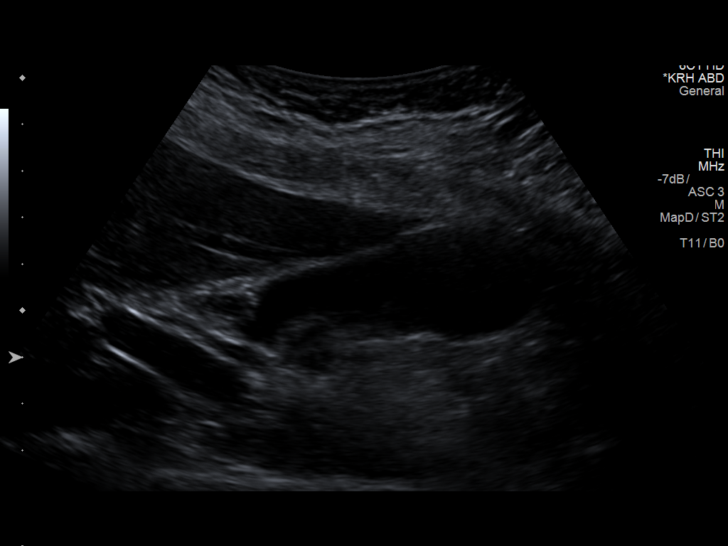
[im 56/75]
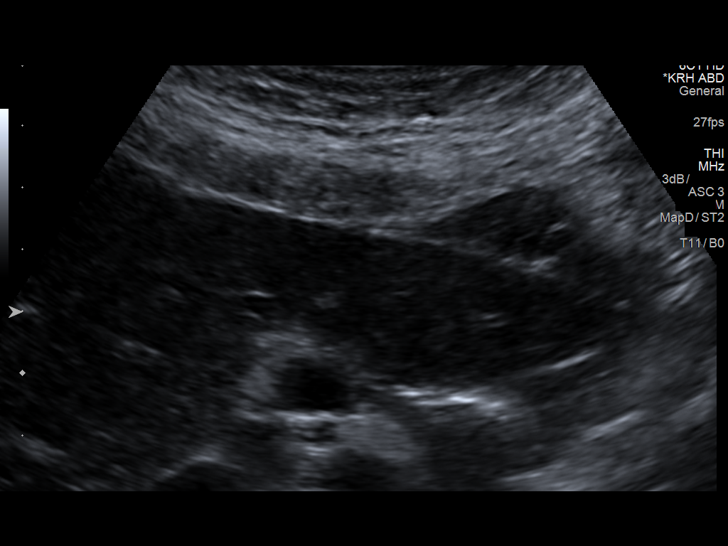
[im 62/75]
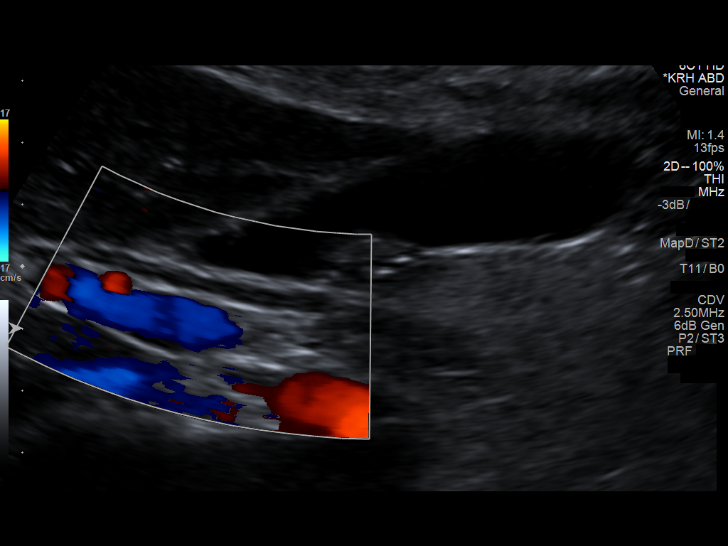
[im 68/75]
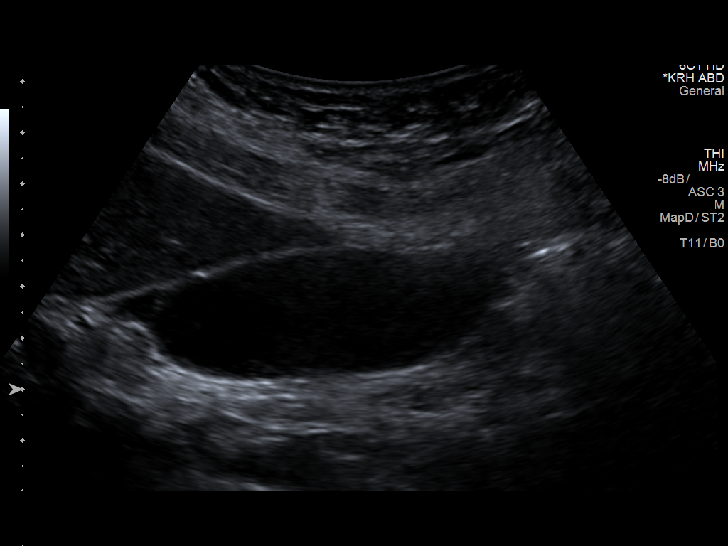
[im 75/75]
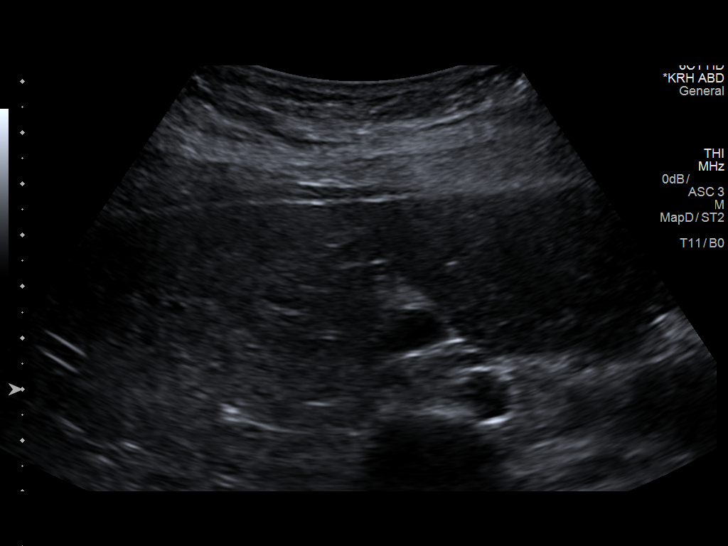

[14 of 25 positions shown; findings below may reference images not displayed]

FINDINGS: Gallbladder:

Normally distended without stones or wall thickening. No
pericholecystic fluid or sonographic Murphy sign.

Common bile duct:

Diameter: 3 mm diameter , normal

Liver:

Normal appearance. No focal mass lesion or nodularity. Portal vein
is patent on color Doppler imaging with normal direction of blood
flow towards the liver.

No RIGHT upper quadrant free fluid.
IMPRESSION: Normal exam.
